# Patient Record
Sex: Male | Born: 1980 | Race: White | Hispanic: No | Marital: Married | Smoking: Former smoker
Health system: Southern US, Community
[De-identification: ages and names within clinical notes are randomized; demographics above are authoritative.]

## PROBLEM LIST (undated history)

## (undated) DIAGNOSIS — K649 Unspecified hemorrhoids: Secondary | ICD-10-CM

## (undated) DIAGNOSIS — B9681 Helicobacter pylori [H. pylori] as the cause of diseases classified elsewhere: Secondary | ICD-10-CM

## (undated) DIAGNOSIS — E119 Type 2 diabetes mellitus without complications: Secondary | ICD-10-CM

## (undated) DIAGNOSIS — K2970 Gastritis, unspecified, without bleeding: Secondary | ICD-10-CM

## (undated) DIAGNOSIS — K219 Gastro-esophageal reflux disease without esophagitis: Secondary | ICD-10-CM

## (undated) DIAGNOSIS — K7581 Nonalcoholic steatohepatitis (NASH): Secondary | ICD-10-CM

## (undated) DIAGNOSIS — E669 Obesity, unspecified: Secondary | ICD-10-CM

## (undated) HISTORY — DX: Type 2 diabetes mellitus without complications: E11.9

## (undated) HISTORY — DX: Gastro-esophageal reflux disease without esophagitis: K21.9

## (undated) HISTORY — DX: Nonalcoholic steatohepatitis (NASH): K75.81

## (undated) HISTORY — PX: OTHER SURGICAL HISTORY: SHX169

## (undated) HISTORY — DX: Gilbert syndrome: E80.4

## (undated) HISTORY — DX: Obesity, unspecified: E66.9

## (undated) HISTORY — DX: Gastritis, unspecified, without bleeding: K29.70

## (undated) HISTORY — DX: Unspecified hemorrhoids: K64.9

## (undated) HISTORY — PX: UPPER GASTROINTESTINAL ENDOSCOPY: SHX188

## (undated) HISTORY — DX: Helicobacter pylori (H. pylori) as the cause of diseases classified elsewhere: B96.81

---

## 2009-05-23 ENCOUNTER — Emergency Department (HOSPITAL_COMMUNITY): Admission: EM | Admit: 2009-05-23 | Discharge: 2009-05-23 | Payer: Self-pay | Admitting: Emergency Medicine

## 2009-05-26 ENCOUNTER — Encounter (INDEPENDENT_AMBULATORY_CARE_PROVIDER_SITE_OTHER): Payer: Self-pay | Admitting: *Deleted

## 2009-06-11 ENCOUNTER — Ambulatory Visit: Payer: Self-pay | Admitting: Gastroenterology

## 2009-06-11 DIAGNOSIS — K5900 Constipation, unspecified: Secondary | ICD-10-CM | POA: Insufficient documentation

## 2009-06-11 DIAGNOSIS — R109 Unspecified abdominal pain: Secondary | ICD-10-CM | POA: Insufficient documentation

## 2009-06-11 DIAGNOSIS — A048 Other specified bacterial intestinal infections: Secondary | ICD-10-CM | POA: Insufficient documentation

## 2009-06-11 DIAGNOSIS — K219 Gastro-esophageal reflux disease without esophagitis: Secondary | ICD-10-CM | POA: Insufficient documentation

## 2009-06-22 ENCOUNTER — Telehealth (INDEPENDENT_AMBULATORY_CARE_PROVIDER_SITE_OTHER): Payer: Self-pay | Admitting: *Deleted

## 2009-06-22 DIAGNOSIS — B9681 Helicobacter pylori [H. pylori] as the cause of diseases classified elsewhere: Secondary | ICD-10-CM

## 2009-06-22 HISTORY — DX: Gastritis, unspecified, without bleeding: B96.81

## 2009-06-23 ENCOUNTER — Encounter: Payer: Self-pay | Admitting: Gastroenterology

## 2009-06-25 ENCOUNTER — Ambulatory Visit: Payer: Self-pay | Admitting: Gastroenterology

## 2009-06-25 ENCOUNTER — Ambulatory Visit (HOSPITAL_COMMUNITY): Admission: RE | Admit: 2009-06-25 | Discharge: 2009-06-25 | Payer: Self-pay | Admitting: Gastroenterology

## 2009-06-25 DIAGNOSIS — K219 Gastro-esophageal reflux disease without esophagitis: Secondary | ICD-10-CM

## 2009-06-25 HISTORY — DX: Gastro-esophageal reflux disease without esophagitis: K21.9

## 2009-08-04 ENCOUNTER — Ambulatory Visit: Payer: Self-pay | Admitting: Gastroenterology

## 2009-08-04 DIAGNOSIS — Z8639 Personal history of other endocrine, nutritional and metabolic disease: Secondary | ICD-10-CM

## 2009-08-04 DIAGNOSIS — Z862 Personal history of diseases of the blood and blood-forming organs and certain disorders involving the immune mechanism: Secondary | ICD-10-CM

## 2009-08-05 ENCOUNTER — Encounter: Payer: Self-pay | Admitting: Gastroenterology

## 2009-08-10 ENCOUNTER — Encounter: Payer: Self-pay | Admitting: Gastroenterology

## 2009-08-10 LAB — CONVERTED CEMR LAB
Ceruloplasmin: 28 mg/dL (ref 21–63)
Ferritin: 244 ng/mL (ref 22–322)
HCV Ab: NEGATIVE
Hep A Total Ab: NEGATIVE
IgG (Immunoglobin G), Serum: 1230 mg/dL (ref 694–1618)
Saturation Ratios: 21 % (ref 20–55)

## 2009-08-13 ENCOUNTER — Ambulatory Visit (HOSPITAL_COMMUNITY): Admission: RE | Admit: 2009-08-13 | Discharge: 2009-08-13 | Payer: Self-pay | Admitting: Gastroenterology

## 2010-02-10 ENCOUNTER — Encounter (INDEPENDENT_AMBULATORY_CARE_PROVIDER_SITE_OTHER): Payer: Self-pay

## 2010-02-10 ENCOUNTER — Ambulatory Visit: Payer: Self-pay | Admitting: Gastroenterology

## 2010-02-10 DIAGNOSIS — K7689 Other specified diseases of liver: Secondary | ICD-10-CM | POA: Insufficient documentation

## 2010-05-02 ENCOUNTER — Encounter (INDEPENDENT_AMBULATORY_CARE_PROVIDER_SITE_OTHER): Payer: Self-pay

## 2010-05-25 ENCOUNTER — Encounter: Payer: Self-pay | Admitting: Gastroenterology

## 2010-05-27 LAB — CONVERTED CEMR LAB
ALT: 27 units/L (ref 0–53)
AST: 16 units/L (ref 0–37)
Bilirubin, Direct: 0.1 mg/dL (ref 0.0–0.3)
Indirect Bilirubin: 0.6 mg/dL (ref 0.0–0.9)
Total Bilirubin: 0.7 mg/dL (ref 0.3–1.2)

## 2010-06-21 NOTE — Assessment & Plan Note (Signed)
Summary: NPP/ABD PAIN.GU   Visit Type:  Initial Consult Referring Provider:  [facility] ER Primary Care Provider:  Day Berneda Rose ?name  Chief Complaint:  Abd pain.  History of Present Illness: 30 y/o obese caucasian male with generalized abd pain that started 3 weeks.  Was seen at [facility] 12/27 & 12/28 at [facility] with chest & abd pain & insomnia.  Also seen on 1/2 at [facility] ER with same abd pain.  c/o constant abd pain "all over"  c/o nausea without vomiting which has now resolved.   c/o heartburn & indigestion for months, getting worse until HP tx.  Finished h pylori tx 3 days ago, feels much better.  Denies dysphagia or odynophagia.  c/o chronic constipation with BM every 3-4 days.  Denies rectal bleeding or melena.  Wt steadily increasing.  Denies NSAIDS now, but was taking advil on regular basis 800mg  3 times per week.  The patient denies dysuria, GU symptoms, cardiac symptoms, cough, and shortness of breath.    Current Problems (verified): 1)  Constipation  (ICD-564.00) 2)  Helicobacter Pylori Infection  (ICD-041.86) 3)  Gerd  (ICD-530.81) 4)  Abdominal Pain  (ICD-789.00)  Current Medications (verified): 1)  No Meds Now 2)  Omeprazole 20 Mg Cpdr (Omeprazole) .... One By Mouth 30 Minutes Before Breakfast For Acid Reflux 3)  Miralax  Powd (Polyethylene Glycol 3350) .Marland KitchenMarland KitchenMarland Kitchen 17 Grams As Needed For Constipation  Allergies (verified): No Known Drug Allergies  Past History:  Past Medical History: Unremarkable  Past Surgical History: right leg benign tumor as child  Family History: No known family history of colorectal carcinoma, IBD, liver or chronic GI problems. Father: (45s) ? 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 Quit 3 mo ago, 18pkyr hx Alcohol Use - no Illicit Drug Use - no Daily Caffeine Use Patient does not get regular exercise.    Smoking Status:  quit Drug Use:  no Does Patient Exercise:  no  Review of Systems      See HPI General:  Complains of sleep disorder; denies fever, chills, sweats, anorexia, fatigue, weakness, malaise, and weight loss. CV:  Denies chest pains, angina, palpitations, syncope, dyspnea on exertion, orthopnea, PND, peripheral edema, and claudication. Resp:  Denies dyspnea at rest, dyspnea with exercise, cough, sputum, wheezing, coughing up blood, and pleurisy. GI:  Denies difficulty swallowing, pain on swallowing, jaundice, bloody BM's, black BMs, and fecal incontinence. GU:  Denies urinary burning, blood in urine, urinary frequency, urinary hesitancy, nocturnal urination, and urinary incontinence. Derm:  Denies rash, itching, dry skin, hives, moles, warts, and unhealing ulcers. Psych:  Denies depression, anxiety, memory loss, suicidal ideation, hallucinations, paranoia, phobia, and confusion. Heme:  Denies bruising, bleeding, and enlarged lymph nodes.  Vital Signs:  Patient profile:   30 year old male Height:      76 inches Weight:      305 pounds BMI:     37.26 Temp:     97.7 degrees F oral Pulse rate:   72 / minute BP sitting:   140 / 92  (left arm) Cuff size:   large  Vitals Entered By: Cloria Spring LPN (June 11, 2009 11:07 AM)  Physical Exam  General:  Well developed, well nourished, no acute distress.obese.   Head:  Normocephalic and atraumatic. Eyes:  Sclera clear, no icterus. Ears:  Normal auditory acuity. Nose:  No deformity, discharge,  or lesions. Mouth:  No deformity or lesions, dentition normal. Neck:  Supple; no masses or thyromegaly. Lungs:  Clear throughout to auscultation. Heart:  Regular rate and rhythm; no murmurs, rubs,  or bruits. Abdomen:  Soft, nontender and nondistended. No masses, hepatosplenomegaly or hernias noted. Normal bowel sounds.without guarding and without rebound.   Msk:  Symmetrical with no gross deformities. Normal posture. Pulses:   Normal pulses noted. Extremities:  No clubbing, cyanosis, edema or deformities noted. Neurologic:  Alert and  oriented x4;  grossly normal neurologically. Skin:  Intact without significant lesions or rashes. Cervical Nodes:  No significant cervical adenopathy. Psych:  Alert and cooperative. Normal mood and affect.  Impression & Recommendations:  Problem # 1:  ABDOMINAL PAIN (ICD-789.00) 30 y/o caucasian male with recent generalized abd pain, nausea & chest pain which resulted in ER visits where he ws found tobe h pylori positive.  Since he has finished treatment, he has done well without any concerns.  I suspect h pylori gastritis as the cullprit of his recent symptoms, but cannot r/o complicated GERD or occult gallbladder disease.  At any rate, since he has complete resolution of symptoms, will place him on PPI and see how he does.  He will contact us for further work-up if pain or nausea returns.  No red flags. Orders: Consultation Level IV (76160)  Problem # 2:  HELICOBACTER PYLORI INFECTION (ICD-041.86) s/p treatment  Problem # 3:  GERD (ICD-530.81) See #1  Problem # 4:  CONSTIPATION (ICD-564.00) Assessment: Improved He also had a bout of self-limited constipation which has resolved.  Has intermittant problems w/ this.  Patient Instructions: 1)  Begin Omeprazole 20 mg daily as directed 2)  Begin Miralax 17 grams daily as needed for constipation 3)  Office visit Dr Darrick Penna in 6 wks 4)  Avoid foods high in acid content ( tomatoes, citrus juices, spicy foods) . Avoid eating within 3 to 4 hours of lying down or before exercising. Do not over eat; try smaller more frequent meals. Elevate head of bed four inches when sleeping.  5)  Constipation and Hemorrhoids brochure given.  6)  GI Reflux brochure given.  7)  Call if severe pain returns or yu get worse. 8)  The medication list was reviewed and reconciled.  All changed / newly prescribed medications were explained.  A complete  medication list was provided to the patient / caregiver. Prescriptions: MIRALAX  POWD (POLYETHYLENE GLYCOL 3350) 17 grams as needed for constipation  #527 grams x 5   Entered and Authorized by:   Joselyn Arrow FNP-BC   Signed by:   Joselyn Arrow FNP-BC on 06/11/2009   Method used:   Electronically to        [facility] (ret[facility])       [address]       [place], [place]  [address]       Ph: [phone]       Fax: [phone]   RxID:   [id number] OMEPRAZOLE 20 MG CPDR (OMEPRAZOLE) one by mouth 30 minutes before breakfast for acid reflux  #31 x 2   Entered and Authorized by:   Joselyn Arrow FNP-BC   Signed by:   Joselyn Arrow FNP-BC on 06/11/2009   Method used:   Electronically to        [facility] (retail)       [address]  [place], [place]  [address]       Ph: [phone]       Fax: [phone]   RxID:   [id number]        Appended Document: NPP/ABD PAIN.GU Pt's wife called, "no better", please schedule EGD w/ [facility].   Appended Document: NPP/ABD PAIN.GU Pt scheduled for EGD on 06/25/09@7 :30am  Pt aware of appt.

## 2010-06-21 NOTE — Assessment & Plan Note (Signed)
Summary: ELEVATED AST, ALT, TBILI 1.6   Visit Type:  Follow-up Visit Primary Care Provider:  [facility]  Chief Complaint:  F/U Elevated liver enzymes.  History of Present Illness: No problems with NV, turning yellow or eyes yellow, or itching. Appetite: pretty good. No rectal bleeding or change in bowel habits. Chest and abd pain better. Last year gained 50 lbs in past year, but has lost 5-6 lbs after goign to gym. On hold now because car broke down. BMs: every day but Miralax as needed.  Current Medications (verified): 1)  No Meds Now 2)  Omeprazole 20 Mg Cpdr (Omeprazole) .... One By Mouth 30 Minutes Before Breakfast For Acid Reflux  Allergies (verified): No Known Drug Allergies  Past History:  Past Medical History: Last updated: 06/11/2009 Unremarkable  Family History: Last updated: 06/11/2009 No known family history of colorectal carcinoma, IBD, liver or chronic GI problems. Father: (32s) ? Mother: (62) DM, anxiety Siblings: 3 sisters 2 brothers healthy  Review of Systems       JAN 2011: LIP 22 T BILI 1.6 ALK PHOS 86 ALT 86 AST 71 ALB 4.5 CR 0.94, UDS NEG. HB 15.9 PLT 270  AAS: NEG UA WBC 3-6 BAC rare  Vital Signs:  Patient profile:   30 year old male Height:      76 inches Weight:      299 pounds BMI:     36.53 Temp:     97.9 degrees F oral Pulse rate:   72 / minute BP sitting:   128 / 90  (left arm) Cuff size:   large  Vitals Entered By: Cloria Spring LPN (August 04, 2009 10:15 AM)  Physical Exam  General:  Well developed, well nourished, no acute distress. Head:  Normocephalic and atraumatic. Eyes:  PERRLA, no icterus. Mouth:  No deformity or lesions. Neck:  Supple; no masses. Lungs:  Clear throughout to auscultation. Heart:  Regular rate and rhythm; no murmurs Abdomen:  Soft, nontender and nondistended.  Normal bowel sounds. Extremities:  No edema or deformities noted. Neurologic:  Alert and  oriented x4;  grossly normal neurologically.  Impression  & Recommendations:  Problem # 1:  GERD (ICD-530.81) Assessment Improved Continue omeprazole daily FOR ONE YEAR AND THEN MAY USE PRN. May try tomato based products.  Problem # 2:  LIVER FUNCTION TESTS, ABNORMAL, HX OF (ICD-V12.2) Likely 2o to NASH. Check labs: ASMA, HBsAg, TOTAL HAV, HEP C AB, CERULOPASMIN, FASTING FERRITIN AND IRON, and imaging.  Continue weight loss program.  OPV in 6 mos.  Patient Instructions: 1)  Continue weight loss program. 2)  Continue omeprazole daily. May try tomato based products. 3)  Get labs and ultrasound done. 4)  Return visit in 6 months.     Appended Document: Orders Update-charge    Clinical Lists Changes  Orders: Added new Service order of Est. Patient Level V (81191) - Signed

## 2010-06-21 NOTE — Miscellaneous (Signed)
Summary: Clinical list update

## 2010-06-21 NOTE — Progress Notes (Signed)
Summary: EGD  Phone Note Call from Patient Call back at Home Phone [phone]   Caller: Girlfriend Call For: Provider Reason for Call: Talk to Doctor Complaint: Abdominal Pain Action Taken: Provider Notified Summary of Call: Pt's girlfriend called stating he is not feeling any better and would like to have the procedure "where they run the light down your throat".  Please advise? Initial call taken by: Ave Filter,  June 22, 2009 4:40 PM

## 2010-06-21 NOTE — Letter (Signed)
Summary: Appointment Reminder  [facility]  [address]   [place], [place] [address]   Phone: [phone]  Fax: [phone]       May 26, 2009   TEO MOEDE [address] [place], [place]  [address] 1980-05-26    Dear Mr. Rosiak,  We have been unable to reach you by phone to schedule a follow up   appointment that was recommended for you by Dr. Jena Gauss. It is very   important that we reach you to schedule an appointment. We hope that you  allow [place] to participate in your health care needs. Please contact us at  [phone] at your earliest convenience to schedule your appointment.  Sincerely,    [facility] R. Roetta Sessions, M.D.    Kassie Mends, M.D. Lorenza Burton, FNP-BC    Tana Coast, PA-C Phone: [phone]    Fax: [phone]

## 2010-06-21 NOTE — Letter (Signed)
Summary: ABD [place] ORDER  ABD [place] ORDER   Imported By: Ave Filter 08/04/2009 14:14:09  _____________________________________________________________________  External Attachment:    Type:   Image     Comment:   External Document

## 2010-06-21 NOTE — Letter (Signed)
Summary: EGD ORDER  EGD ORDER   Imported By: Ricard Dillon 06/23/2009 12:50:43  _____________________________________________________________________  External Attachment:    Type:   Image     Comment:   External Document

## 2010-06-21 NOTE — Letter (Signed)
Summary: Internal Other /Lab order/ 08/04/2009  Internal Other /Lab order/ 08/04/2009   Imported By: Cloria Spring LPN 09/81/1914 78:29:56  _____________________________________________________________________  External Attachment:    Type:   Image     Comment:   External Document  Appended Document: GERD, NASH F/U OPV IS IN THE COMPUTER  Appended Document: Orders Update    Clinical Lists Changes  Orders: Added new Service order of Est. Patient Level II (21308) - Signed

## 2010-06-23 NOTE — Letter (Signed)
Summary: Recall, Labs Needed  [facility]  [address]   [place], [place] [address]   Phone: [phone]  Fax: [phone]    May 02, 2010  ATHA MCBAIN [address] [place], [place]  [address] 1981-04-18   Dear Mr. Folger,   Our records indicate it is time to repeat your blood work.  You can take the enclosed form to the lab on or near the date indicated.  Please make note of the new location of the lab:   [address], 2nd floor   [facility]  Our office will call you within a week to ten business days with the results.  If you do not hear from [place] in 10 business days, you should call the office.  If you have any questions regarding this, call the office at [phone], and ask for the nurse.  Labs are due on 05/26/2010.   Sincerely,    Hendricks Limes LPN  [facility] Ph: [phone]   Fax: [phone]

## 2010-06-23 NOTE — Assessment & Plan Note (Signed)
Summary: GERD, NASH   Visit Type:  Follow-up Visit Primary Care Provider:  [facility]  Chief Complaint:  Genella Rife and Elevated liver enzymes.  History of Present Illness: Last labs 2 mos ago. Heartburn controlled. No itching, yellow eyes, fatigue. Lost 3 lbs since last visit Mar 2011. Working out at home: May be every day.   Current Medications (verified): 1)  No Meds Now 2)  Omeprazole 20 Mg Cpdr (Omeprazole) .... One By Mouth 30 Minutes Before Breakfast For Acid Reflux  Allergies (verified): No Known Drug Allergies  Past History:  Past Medical History: NASH GERD  Family History: Reviewed history from 06/11/2009 and no changes required. No known family history of colorectal carcinoma, IBD, liver or chronic GI problems. Father: (32s) ? 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 Quit 3 mo ago, 18pkyr hx Alcohol Use - no Illicit Drug Use - no Daily Caffeine Use Patient does getS INTERMITTENT regular exercise.   Vital Signs:  Patient profile:   30 year old male Height:      76 inches Weight:      296.50 pounds BMI:     36.22 Temp:     97.8 degrees F oral Pulse rate:   72 / minute BP sitting:   128 / 76  (left arm) Cuff size:   large  Vitals Entered By: Cloria Spring LPN (February 10, 2010 9:41 AM)  Physical Exam  General:  Well developed, well nourished, no acute distress. Head:  Normocephalic and atraumatic. Eyes:  PERRL, no icterus. Mouth:  No deformity or lesions. Lungs:  Clear throughout to auscultation. Heart:  Regular rate and rhythm; no murmurs Abdomen:  Soft, nontender and nondistended.  Normal bowel sounds. obese.    Impression & Recommendations:  Problem # 1:  GERD (ICD-530.81) Assessment Improved May try doing w/o meds but if Sx of GERD pt should resume meds. Refill X1 year. OPV in  12 mos.  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 More ideal weight is 245-250 lbs, which places the pt at being overweight, NOT obese. Explained to pt that losing weight will improve enzymes and persistent elevation in enzymes may lead to cirrhosis. OPV in 12 mos. Prescriptions: OMEPRAZOLE 20 MG CPDR (OMEPRAZOLE) one by mouth 30 minutes before breakfast for acid reflux  #30 x 11   Entered and Authorized by:   West Bali MD   Signed by:   West Bali MD on 02/10/2010   Method used:   Electronically to        [facility] (retail)       [address]       [place], [place]  [address]       Ph: [phone]       Fax: [phone]   RxID:   [id number]   Appended Document: GERD, NASH F/U OPV IS IN THE COMPUTER  Appended Document: Orders Update    Clinical Lists Changes  Orders: Added new Service order of Est. Patient Level II (38756) - Signed      Appended Document: GERD, NASH APR 2011 HB 15.9 PLT 329 ESR 1

## 2010-08-07 LAB — URINE MICROSCOPIC-ADD ON

## 2010-08-07 LAB — CBC
MCHC: 34.4 g/dL (ref 30.0–36.0)
Platelets: 270 10*3/uL (ref 150–400)
RBC: 5.38 MIL/uL (ref 4.22–5.81)
RDW: 12.4 % (ref 11.5–15.5)

## 2010-08-07 LAB — URINALYSIS, ROUTINE W REFLEX MICROSCOPIC
Bilirubin Urine: NEGATIVE
Nitrite: NEGATIVE
Urobilinogen, UA: 0.2 mg/dL (ref 0.0–1.0)

## 2010-08-07 LAB — COMPREHENSIVE METABOLIC PANEL
Alkaline Phosphatase: 86 U/L (ref 39–117)
Creatinine, Ser: 0.94 mg/dL (ref 0.4–1.5)
Glucose, Bld: 112 mg/dL — ABNORMAL HIGH (ref 70–99)
Potassium: 3.8 mEq/L (ref 3.5–5.1)
Sodium: 137 mEq/L (ref 135–145)
Total Protein: 8.1 g/dL (ref 6.0–8.3)

## 2010-08-07 LAB — LIPASE, BLOOD: Lipase: 22 U/L (ref 11–59)

## 2010-08-07 LAB — RAPID URINE DRUG SCREEN, HOSP PERFORMED
Cocaine: NOT DETECTED
Opiates: NOT DETECTED

## 2010-08-07 LAB — DIFFERENTIAL
Basophils Absolute: 0 10*3/uL (ref 0.0–0.1)
Eosinophils Absolute: 0.1 10*3/uL (ref 0.0–0.7)
Eosinophils Relative: 1 % (ref 0–5)
Lymphs Abs: 1.8 10*3/uL (ref 0.7–4.0)

## 2010-08-12 IMAGING — US US ABDOMEN COMPLETE
1 series · 14 of 25 positions shown · non-contrast
Comparison: None

CLINICAL DATA: Transaminitis

ULTRASOUND ABDOMEN:
TECHNIQUE: Sonography of upper abdominal structures was performed.

[Series 1: us abdomen complete · 0.35mm/px · 14 of 65 slices shown]
[im 1/65]
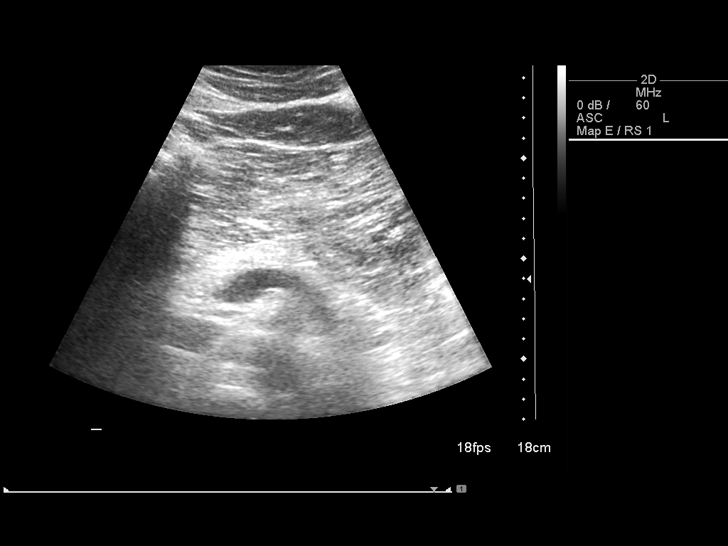
[im 6/65]
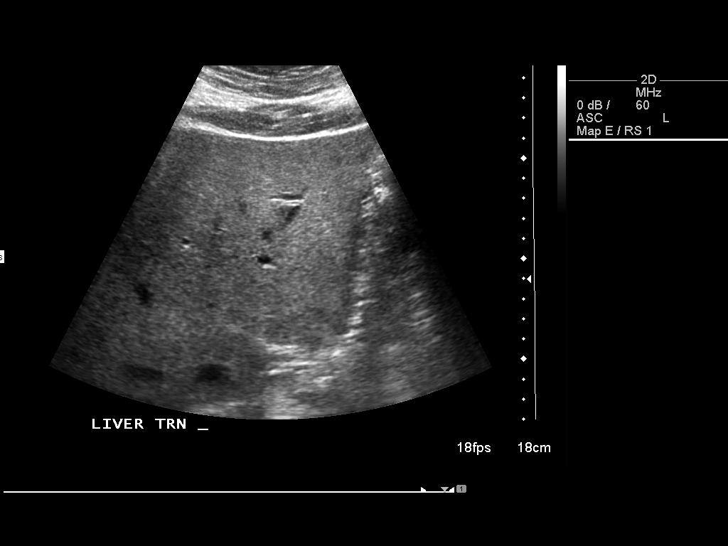
[im 11/65]
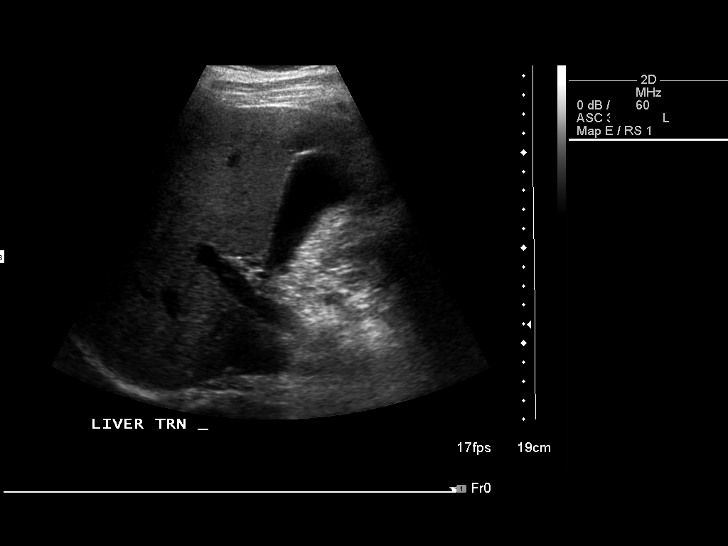
[im 17/65]
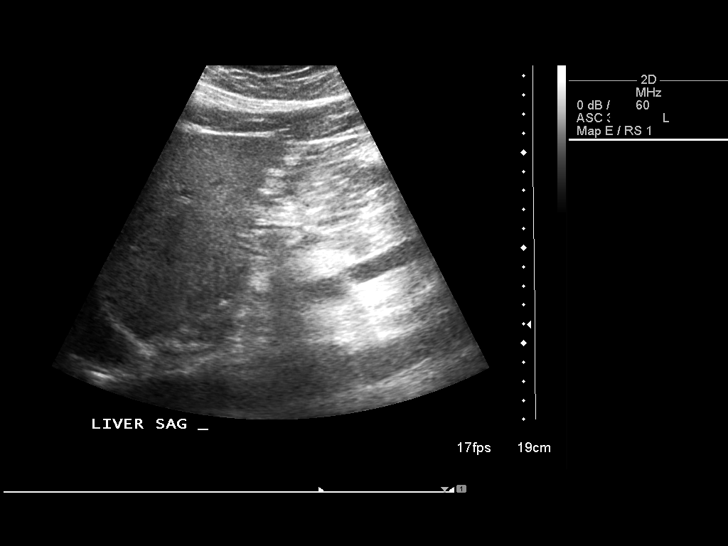
[im 22/65]
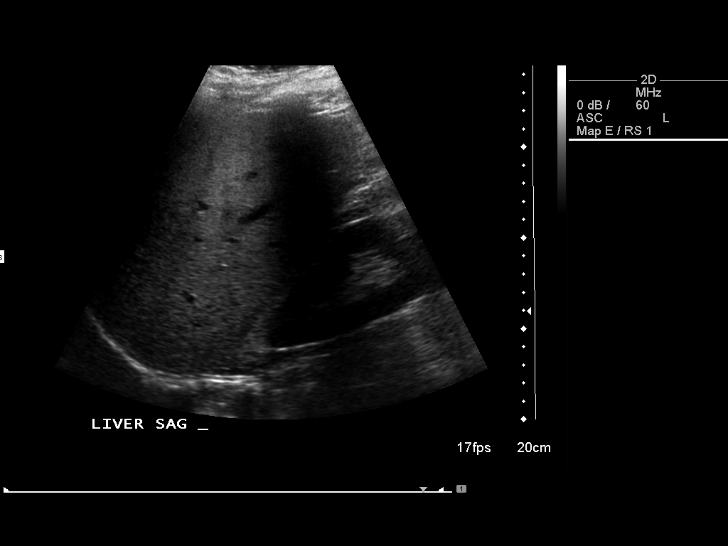
[im 25/65]
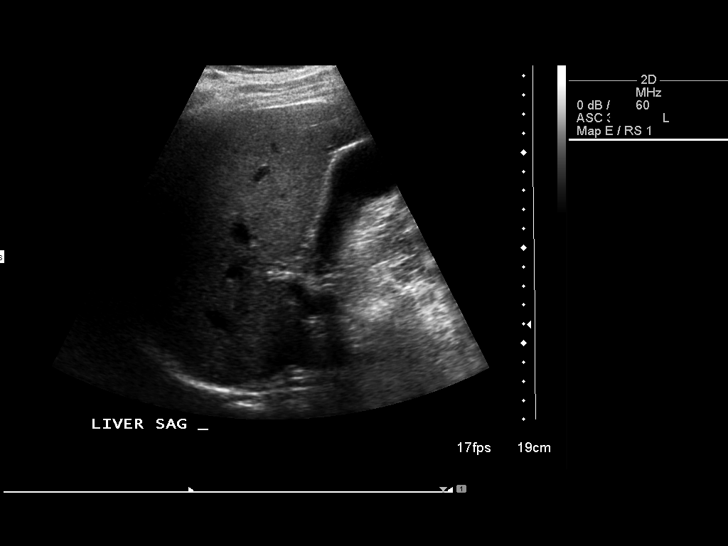
[im 30/65]
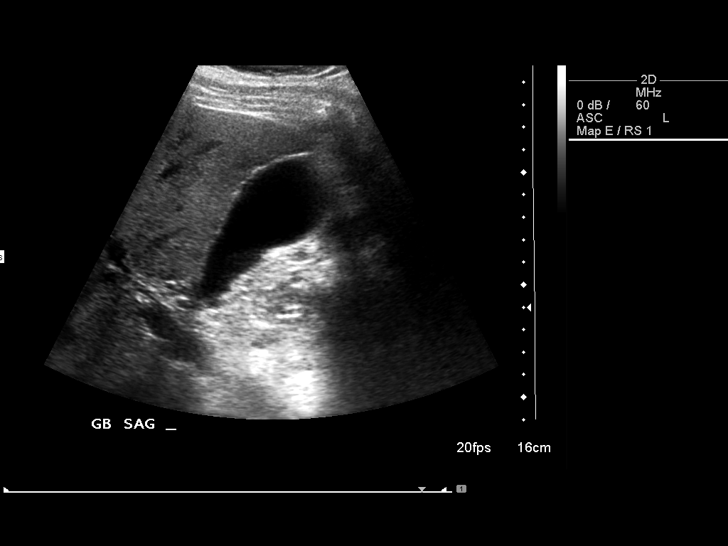
[im 35/65]
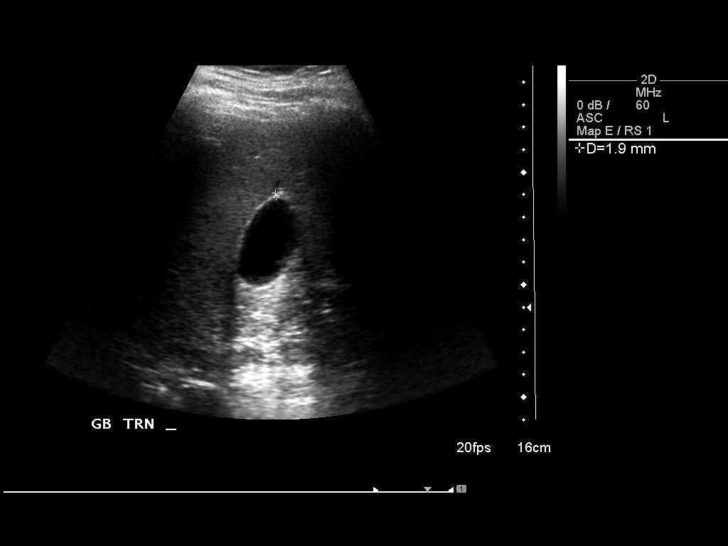
[im 41/65]
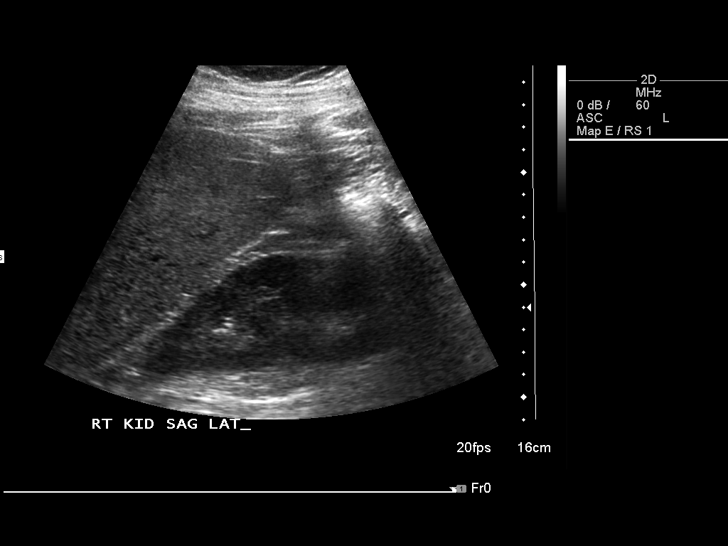
[im 43/65]
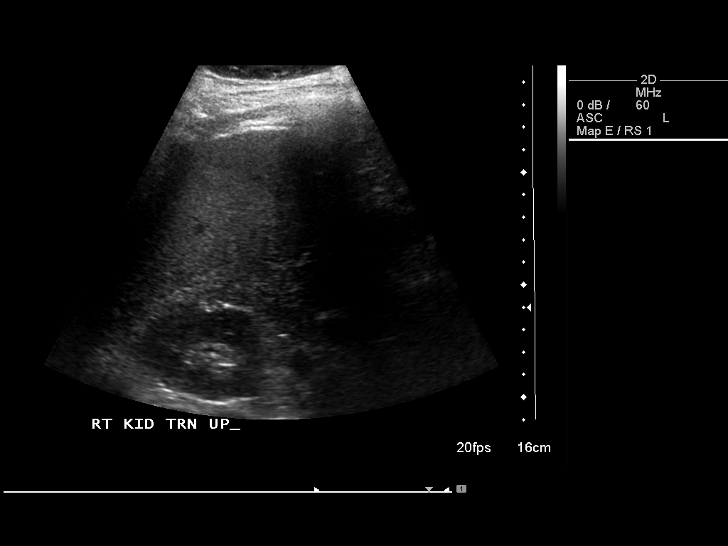
[im 49/65]
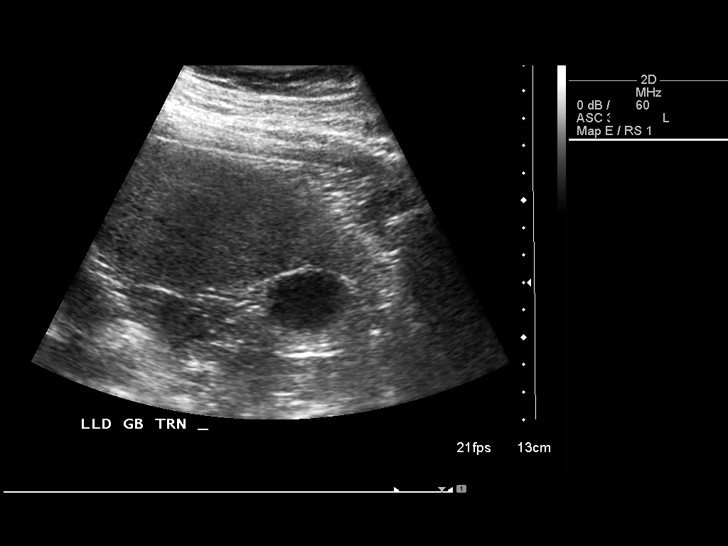
[im 54/65]
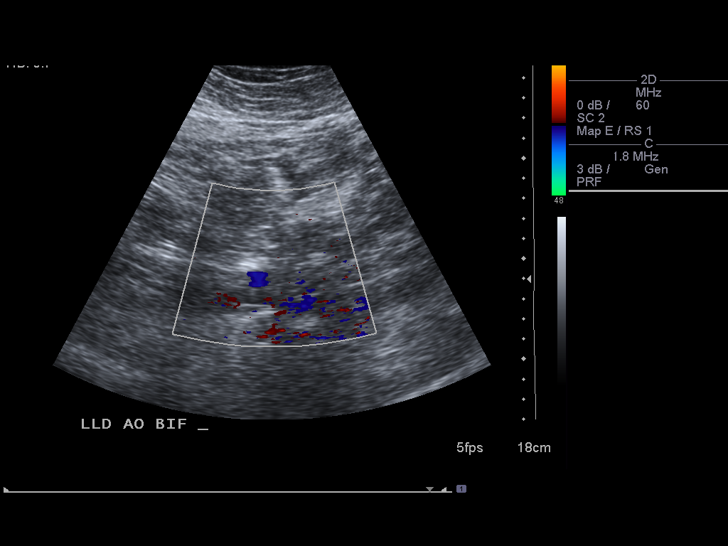
[im 59/65]
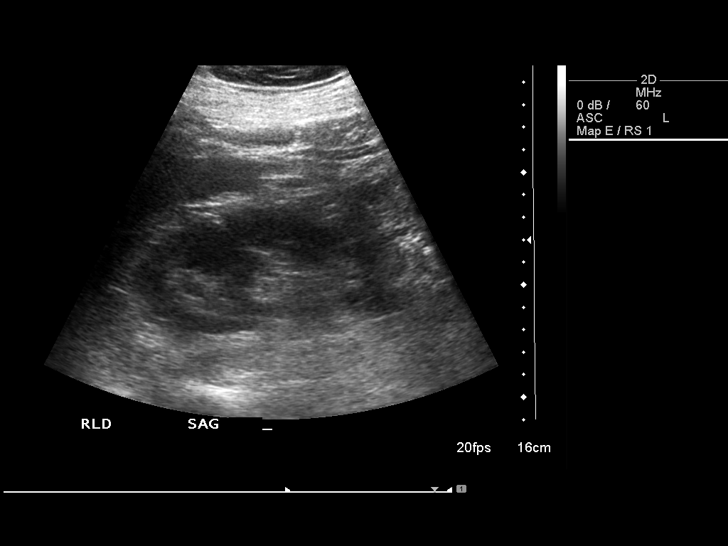
[im 65/65]
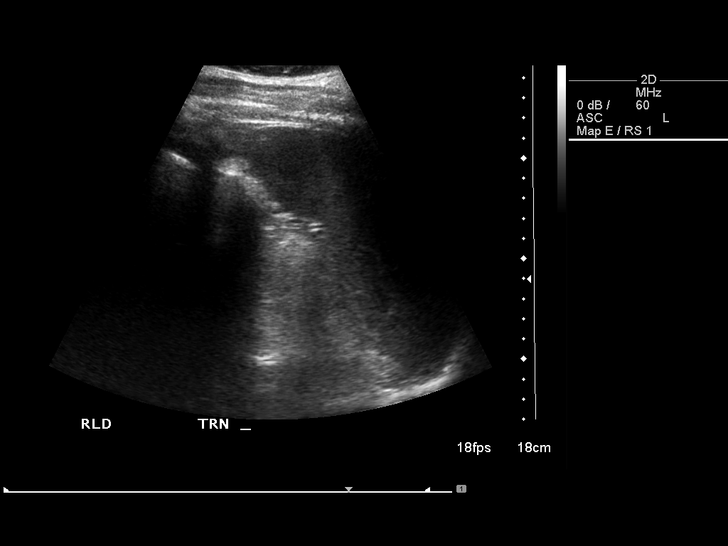

[14 of 25 positions shown; findings below may reference images not displayed]

Gallbladder:  Normally distended without stones or wall thickening.
No pericholecystic fluid or sonographic Murphy's sign.

Common bile duct:  6 mm diameter, upper normal.

Liver:  Echogenic, likely fatty infiltration, though this can be
seen with cirrhosis and certain infiltrative disorders. No focal
hepatic mass or nodularity.

IVC:  Unremarkable

Pancreas:  Normal appearance

Spleen:  Normal appearance, 11.0 cm length.

Right kidney:  11.8 cm length.  Normal morphology without mass or
hydronephrosis.

Left kidney:  12.5 cm length.  Normal morphology without mass or
hydronephrosis.

Aorta:  Unremarkable

Other:  No free fluid
IMPRESSION: Probable fatty infiltration of liver as above.
No acute abnormalities.

## 2010-10-06 ENCOUNTER — Encounter: Payer: Self-pay | Admitting: Urgent Care

## 2010-10-06 ENCOUNTER — Ambulatory Visit (INDEPENDENT_AMBULATORY_CARE_PROVIDER_SITE_OTHER): Payer: Medicaid Other | Admitting: Urgent Care

## 2010-10-06 VITALS — BP 150/83 | HR 74 | Temp 97.4°F | Ht 76.0 in | Wt 310.0 lb

## 2010-10-06 DIAGNOSIS — K219 Gastro-esophageal reflux disease without esophagitis: Secondary | ICD-10-CM

## 2010-10-06 DIAGNOSIS — R197 Diarrhea, unspecified: Secondary | ICD-10-CM

## 2010-10-06 DIAGNOSIS — K7689 Other specified diseases of liver: Secondary | ICD-10-CM

## 2010-10-06 MED ORDER — HYOSCYAMINE SULFATE 0.125 MG SL SUBL: 0.1250 mg | SUBLINGUAL_TABLET | Freq: Three times a day (TID) | SUBLINGUAL | Status: DC

## 2010-10-06 NOTE — Progress Notes (Signed)
Cc to PCP 

## 2010-10-06 NOTE — Assessment & Plan Note (Addendum)
Well controlled on omeprazole 20mg daily.  °

## 2010-10-06 NOTE — Progress Notes (Signed)
Referring Provider: Juliette Alcide, [place] Primary Care Physician:  Juliette Alcide, [place] Primary Gastroenterologist:  Dr. Darrick Penna  Chief Complaint  Patient presents with  . Diarrhea    HPI:  Frank Lucero is a 30 y.o. male here for a new evaluation of positive IBD panel/diarrhea.  Previously been followed by Dr Darrick Penna for NASH & GERD.  Wt up 16# since last OV 8 mo ago.  C/o lower abd pain every time he leaves house x 1-1/2 yrs.  Very rarely happens at home.  Pain feels like twisting intestines, diaphoresis, & diarrhea.  Goes to bathroom w/ 4 minutes of diarrhea.  Denies mucus, rectal bleeding, or melena.  Episodes 1-2 times per day a few days per week if he leaves the house.   Pain 8/10 at worst, always relieved w/ defecation.  BM 1-2 per day.  Denies nausea, vomiting, heartburn or indigestion.  Denies dysphagia or odynophagia.  Denies fever/chills.  Appetite ok.  Never had colonoscopy.  Tried pepto-no help.  Labs from 09/28/10 show positive saocharomyces cerevisiae IgA,  Otherwise negative IBD panel.  Normal CBC, CMP, TSH.  Past Medical History  Diagnosis Date  . NASH (nonalcoholic steatohepatitis)   . GERD (gastroesophageal reflux disease) 06/25/09    EGD Dr Darrick Penna small HH, mild gastritis, duodenitis  . Obesity   . Helicobacter pylori gastritis 06/2009    s/p treatment    Past Surgical History  Procedure Date  . Benign tumor     Right leg as a child    Current Outpatient Prescriptions  Medication Sig Dispense Refill  . omeprazole (PRILOSEC) 20 MG capsule Take 20 mg by mouth daily.        . hyoscyamine (LEVSIN/SL) 0.125 MG SL tablet Place 1 tablet (0.125 mg total) under the tongue 4 (four) times daily -  before meals and at bedtime.  90 tablet  1    Allergies as of 10/06/2010  . (No Known Allergies)    Family History  Problem Relation Age of Onset  . Colon cancer Maternal Grandfather 6    History   Social History  . Marital Status: Single    Spouse Name: N/A    Number  of Children: 2  . Years of Education: N/A   Occupational History  . 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 Smoking status: Former Smoker -- 1.0 packs/day for 18 years    Types: Cigarettes    Quit date: 11/10/2009  . Smokeless tobacco: Not on file  . Alcohol Use: No  . Drug Use: No  . Sexually Active: Not on file   Review of Systems: Gen: Denies any fever, chills, sweats, anorexia, fatigue, weakness, malaise, weight loss, and sleep disorder CV: Denies chest pain, angina, palpitations, syncope, orthopnea, PND, peripheral edema, and claudication. Resp: Denies dyspnea at rest, dyspnea with exercise, cough, sputum, wheezing, coughing up blood, and pleurisy. GI: Denies vomiting blood, jaundice, and fecal incontinence.    Psych: Denies depression, anxiety, memory loss, suicidal ideation, hallucinations, paranoia, and confusion.  C/o agoraphobia. Heme: Denies bruising, bleeding, and enlarged lymph nodes.  Physical Exam: BP 150/83  Pulse 74  Temp(Src) 97.4 F (36.3 C) (Temporal)  Ht 6\' 4"  (1.93 m)  Wt 310 lb (140.615 kg)  BMI 37.73 kg/m2 General:   Alert,  Well-developed, obese, pleasant and cooperative in NAD Head:  Normocephalic and atraumatic. Eyes:  Sclera clear, no icterus.   Conjunctiva pink. Mouth:  No deformity or  lesions, dentition normal. Neck:  Supple; no masses or thyromegaly. Heart:  Regular rate and rhythm; no murmurs, clicks, rubs,  or gallops. Abdomen:  Obese, Soft, nontender and nondistended. No masses, hepatosplenomegaly or hernias noted. Normal bowel sounds, without guarding, and without rebound.   Msk:  Symmetrical without gross deformities. Normal posture. Pulses:  Normal pulses noted. Extremities:  Without clubbing or edema. Neurologic:  Alert and  oriented x4;  grossly normal neurologically. Skin:  Intact without significant lesions or rashes. Cervical Nodes:  No significant cervical adenopathy. Psych:  Alert and  cooperative. Normal mood and affect.

## 2010-10-06 NOTE — Assessment & Plan Note (Addendum)
Frank Lucero is a 30 y.o. caucasian male w/ chronic intermittent diarrhea.  His BMI is 37.73.  His symptoms meet ROME diagnostic criteria for IBS.  There are no alarm features including weight loss, bleeding, anemia, fever.  His diarrhea is intermittent, post-prandial & almost always associated with leaving his home, only present a few days per week.  In this setting, a positive IgA saocharomyces cerevisiae may not be clinically significant.  A small percentage of healthy controls are positive for either IgA or IgG.  It can be a marker for other diseases as well including celiac disease, Behcet's, autoimmune liver disease or rheumatological diseases.  Keeping that in mind, we will treat him symptomatically for IBS.  If no improvement in symptoms or he develops alarm features, would pursue further evaluation w/ ileocolonscopy w/ Dr Darrick Penna & consider celiac ab evaluation.  Office visit in 4 weeks to assess his progress w/ Dr Darrick Penna. Levsin 0.125mg  ac/hs prn diarrhea Fiber supplement of choice daily ALIGN once daily If bleeding, fever, severe pain, vomiting, severe diarrhea, please call sooner

## 2010-10-06 NOTE — Assessment & Plan Note (Signed)
NAFLD in setting of obesity.  Normal LFTs.  Instructions for fatty liver: Recommend 1-2# weight loss per week until ideal body weight through exercise & diet. Low fat/cholesterol diet. Gradually increase exercise from 15 min daily up to 1 hr per day 5 days/week. Limit alcohol use.

## 2010-10-06 NOTE — Patient Instructions (Addendum)
Try ALIGN probiotic once daily Use levsin 0.125mg  as directed for diarrhea Start fiber supplement of choice as soon as possible and take every day. If bleeding, fever, severe pain, vomiting, severe diarrhea, please call sooner  Irritable Bowel Syndrome (Spastic Colon) Irritable Bowel Syndrome (IBS) is caused by a disturbance of normal bowel function. Other terms used are spastic colon, mucous colitis, and irritable colon. It does not require surgery, nor does it lead to cancer. There is no cure for IBS. But with proper diet, stress reduction, and medication, you will find that your problems (symptoms) will gradually disappear or improve. IBS is a common digestive disorder. It usually appears in late adolescence or early adulthood. Women develop it twice as often as men. CAUSES After food has been digested and absorbed in the small intestine, waste material is moved into the colon (large intestine). In the colon, water and salts are absorbed from the undigested products coming from the small intestine. The remaining residue, or fecal material, is held for elimination. Under normal circumstances, gentle, rhythmic contractions on the bowel walls push the fecal material along the colon towards the rectum. In IBS, however, these contractions are irregular and poorly coordinated. The fecal material is either retained too long, resulting in constipation, or expelled too soon, producing diarrhea. SYMPTOMS  The most common symptom of IBS is pain. It is typically in the lower left side of the belly (abdomen). But it may occur anywhere in the abdomen. It can be felt as heartburn, backache, or even as a dull pain in the arms or shoulders. The pain comes from excessive bowel-muscle spasms and from the buildup of gas and fecal material in the colon. This pain:  Can range from sharp belly (abdominal) cramps to a dull, continuous ache.   Usually worsens soon after eating.   Is typically relieved by having a bowel  movement or passing gas.  Abdominal pain is usually accompanied by constipation. But it may also produce diarrhea. The diarrhea typically occurs right after a meal or upon arising in the morning. The stools are typically soft and watery. They are often flecked with secretions (mucus). Other symptoms of IBS include:  Bloating.  Loss of appetite.   Heartburn.  Feeling sick to your stomach  (nausea).   Belching  Vomiting   Gas.  IBS may also cause a number of symptoms that are unrelated to the digestive system:  Fatigue.  Headaches.   Anxiety  Shortness of breath   Difficulty in concentrating.  Dizziness.   These symptoms tend to come and go. DIAGNOSIS The symptoms of IBS closely mimic the symptoms of other, more serious digestive disorders. So your caregiver may wish to perform a variety of additional tests to exclude these disorders. He/she wants to be certain of learning what is wrong (diagnosis). The nature and purpose of each test will be explained to you. TREATMENT A number of medications are available to help correct bowel function and/or relieve bowel spasms and abdominal pain. Among the drugs available are:  Mild, non-irritating laxatives for severe constipation and to help restore normal bowel habits.   Specific anti-diarrheal medications to treat severe or prolonged diarrhea.   Anti-spasmodic agents to relieve intestinal cramps.   Your caregiver may also decide to treat you with a mild tranquilizer or sedative during unusually stressful periods in your life.  The important thing to remember is that if any drug is prescribed for you, make sure that you take it exactly as directed. Make sure that  your caregiver knows how well it worked for you. HOME CARE INSTRUCTIONS   Avoid foods that are high in fat or oils. Some examples ZOX:WRUEA cream, butter, frankfurters, sausage, and other fatty meats.   Avoid foods that have a laxative effect, such as fruit, fruit juice, and  dairy products.   Cut out carbonated drinks, chewing gum, and "gassy" foods, such as beans and cabbage. This may help relieve bloating and belching.   Bran taken with plenty of liquids may help relieve constipation.   Keep track of what foods seem to trigger your symptoms.   Avoid emotionally charged situations or circumstances that produce anxiety.   Start or continue exercising.   Get plenty of rest and sleep.  MAKE SURE YOU:   Understand these instructions.   Will watch your condition.   Will get help right away if you are not doing well or get worse.  Document Released: 05/08/2005 Document Re-Released: 09/24/2008 [facility] Patient Information 2011 [place], [place].

## 2010-10-10 ENCOUNTER — Other Ambulatory Visit: Payer: Self-pay

## 2010-10-10 DIAGNOSIS — K589 Irritable bowel syndrome without diarrhea: Secondary | ICD-10-CM

## 2010-10-10 NOTE — Progress Notes (Signed)
Pt informed. Lab orders and stool bottles at front for pick-up.

## 2010-10-10 NOTE — Progress Notes (Signed)
Needs TTG IgA and stools studies if not already done. E: 30 visit with [facility] in 3-4 weeks.

## 2010-10-10 NOTE — Progress Notes (Signed)
Please call pt & let him know Dr. Darrick Penna has reviewed his reports & wants labs listed below to r/o celiac disease & infection & place orders below. Thanks

## 2010-10-11 ENCOUNTER — Other Ambulatory Visit: Payer: Self-pay | Admitting: Gastroenterology

## 2010-10-11 ENCOUNTER — Encounter: Payer: Self-pay | Admitting: Urgent Care

## 2010-10-12 LAB — TISSUE TRANSGLUTAMINASE, IGA: Tissue Transglutaminase Ab, IgA: 3.9 U/mL (ref <20)

## 2010-10-12 LAB — IGA: IgA: 387 mg/dL — ABNORMAL HIGH (ref 68–378)

## 2010-10-12 LAB — GIARDIA ANTIGEN: Giardia Screen (EIA): NEGATIVE

## 2010-10-12 LAB — CLOSTRIDIUM DIFFICILE BY PCR: Toxigenic C. Difficile by PCR: NOT DETECTED

## 2010-10-13 ENCOUNTER — Telehealth: Payer: Self-pay | Admitting: Urgent Care

## 2010-10-13 NOTE — Telephone Encounter (Signed)
Cc to Dr. Leandrew Koyanagi

## 2010-10-13 NOTE — Telephone Encounter (Signed)
Please call pt- There is no c diff or inflammation noted in his stools There is no evidence of celiac disease on his blood work IgA was  slightly elevated.  This is non-specific. Keep FU as planned w/ Dr Darrick Penna ZO:XWRU to PCP

## 2010-10-13 NOTE — Telephone Encounter (Signed)
Routed to [facility]/ labs to PCP.

## 2010-10-13 NOTE — Telephone Encounter (Signed)
Cc to Dr. Burdine 

## 2010-10-13 NOTE — Telephone Encounter (Signed)
Pt informed of the results.

## 2010-10-13 NOTE — Telephone Encounter (Deleted)
l °

## 2010-10-21 HISTORY — PX: COLONOSCOPY: SHX174

## 2010-10-24 ENCOUNTER — Encounter: Payer: Self-pay | Admitting: Gastroenterology

## 2010-10-24 NOTE — Progress Notes (Signed)
Pt is aware of OV on 11/10/10 at 330pm with SF

## 2010-11-10 ENCOUNTER — Encounter: Payer: Self-pay | Admitting: Gastroenterology

## 2010-11-10 ENCOUNTER — Ambulatory Visit (INDEPENDENT_AMBULATORY_CARE_PROVIDER_SITE_OTHER): Payer: Medicaid Other | Admitting: Gastroenterology

## 2010-11-10 VITALS — BP 137/77 | HR 82 | Temp 97.6°F | Ht 76.0 in | Wt 311.0 lb

## 2010-11-10 DIAGNOSIS — Z862 Personal history of diseases of the blood and blood-forming organs and certain disorders involving the immune mechanism: Secondary | ICD-10-CM

## 2010-11-10 DIAGNOSIS — K589 Irritable bowel syndrome without diarrhea: Secondary | ICD-10-CM

## 2010-11-10 DIAGNOSIS — Z8639 Personal history of other endocrine, nutritional and metabolic disease: Secondary | ICD-10-CM

## 2010-11-10 DIAGNOSIS — K625 Hemorrhage of anus and rectum: Secondary | ICD-10-CM

## 2010-11-10 MED ORDER — IMIPRAMINE HCL 10 MG PO TABS: ORAL_TABLET | ORAL | Status: DC

## 2010-11-10 NOTE — Progress Notes (Signed)
Cc to Dr. Burdine 

## 2010-11-10 NOTE — Progress Notes (Signed)
  Subjective:    Patient ID: Frank Lucero, male    DOB: 05/31/1980, 30 y.o.   MRN: [id number]  PCP: Leandrew Koyanagi  HPI Didn't take Levsin. Has seen blood in his tool but no straining or heavy lifting. Happened after he got a rectal exam. Saw blood for qod now. Yesterday BM: regular, soft. Last time watery stool: 1 hour ago. DOESN'T WANT TO TAKE FLUOXETINE. Has 2 kids at home that he cares for. Girlfriend build blowers. Milk: cereal every AM. Rare ice cream. ICEE sticks. Cheese: regular. Pain in abd better with passing gas or BM. No fever or chills. Feels good when he eaves the house.   Past Medical History  Diagnosis Date  . GERD (gastroesophageal reflux disease) 06/25/09    EGD Dr Darrick Penna small HH, mild gastritis, duodenitis  . Obesity   . Helicobacter pylori gastritis 06/2009    s/p treatment  . NASH (nonalcoholic steatohepatitis) JAN 2011 305 LBS, BMI 37.14 Aug 2009 ABD U/S-FATTY LIVER   Past Surgical History  Procedure Date  . Benign tumor     Right leg as a child  . Upper gastrointestinal endoscopy FEB 2011 [place], [facility]    HH, CHRONIC GASTRITIS, DUODENITIS   No Known Allergies Current Outpatient Prescriptions  Medication Sig Dispense Refill  . omeprazole (PRILOSEC) 20 MG capsule Take 20 mg by mouth daily.        .       Family History  Problem Relation Age of Onset  . Colon cancer Maternal Grandfather 52      Review of Systems  All other systems reviewed and are negative.       Objective:   Physical Exam  Vitals reviewed. Constitutional: He is oriented to person, place, and time. He appears well-developed and well-nourished. No distress.  HENT:  Head: Normocephalic and atraumatic.  Mouth/Throat: Oropharynx is clear and moist. No oropharyngeal exudate.  Eyes: Pupils are equal, round, and reactive to light. No scleral icterus.  Neck: Normal range of motion. Neck supple.  Cardiovascular: Normal rate, regular rhythm and normal heart sounds.   Pulmonary/Chest: Effort  normal and breath sounds normal.  Abdominal: Soft. Bowel sounds are normal. He exhibits no distension. There is Tenderness: MILD TTP IN RUQ/RLQ.Marland Kitchen There is no rebound and no guarding.  Musculoskeletal: He exhibits no edema.  Lymphadenopathy:    He has no cervical adenopathy.  Neurological: He is alert and oriented to person, place, and time.  Skin: No rash noted.          Assessment & Plan:

## 2010-11-10 NOTE — Patient Instructions (Signed)
Take Imipramine at bedtime. It can cause drowsiness, constipation, dry mouth, or difficulty urinating. Colonoscopy June 28. HOLD FIBER UNTIL AFTER YOUR COLONOSCOPY. CONTINUE YOUR PROBIOTICS. Follow up in 3 mos.

## 2010-11-11 NOTE — Progress Notes (Signed)
Pt is aware of OV for 8/16 @ 4pm with SF

## 2010-11-14 ENCOUNTER — Encounter: Payer: Self-pay | Admitting: Gastroenterology

## 2010-11-14 DIAGNOSIS — K625 Hemorrhage of anus and rectum: Secondary | ICD-10-CM | POA: Insufficient documentation

## 2010-11-14 DIAGNOSIS — K589 Irritable bowel syndrome without diarrhea: Secondary | ICD-10-CM | POA: Insufficient documentation

## 2010-11-14 NOTE — Assessment & Plan Note (Signed)
Most likely 2o to hemorrhoids. The differential diagnosis includes colorectal polyp, and less colon CA or AVM.  TCS June 28 w/ propofol 2o to polypharmacy and unable to be adequately sedated using high dose Demerol/ used with upper endoscopy in 2009.

## 2010-11-14 NOTE — Assessment & Plan Note (Signed)
MIXED PATTERN. Sx not ideally controlled, now with diarrhea (CDIFF AND TTG IGA  NEG)  Take Imipramine at bedtime. It can cause drowsiness, constipation, dry mouth, or difficulty urinating. Colonoscopy June 28. HOLD FIBER UNTIL AFTER YOUR COLONOSCOPY. CONTINUE YOUR PROBIOTICS. Follow up in 3 mos.

## 2010-11-14 NOTE — Assessment & Plan Note (Signed)
Encouraged weight loss 

## 2010-11-15 ENCOUNTER — Encounter (HOSPITAL_COMMUNITY)
Admission: RE | Admit: 2010-11-15 | Discharge: 2010-11-15 | Disposition: A | Discharge status: Home / Self Care | Payer: Medicaid Other | Source: Ambulatory Visit | Attending: Gastroenterology | Admitting: Gastroenterology

## 2010-11-15 LAB — BASIC METABOLIC PANEL
CO2: 24 mEq/L (ref 19–32)
GFR calc Af Amer: >60 mL/min (ref >60)
Glucose, Bld: 95 mg/dL (ref 70–99)
Sodium: 137 mEq/L (ref 135–145)

## 2010-11-15 LAB — HEMOGLOBIN AND HEMATOCRIT, BLOOD: Hemoglobin: 15.9 g/dL (ref 13.0–17.0)

## 2010-11-17 ENCOUNTER — Other Ambulatory Visit: Payer: Self-pay | Admitting: Gastroenterology

## 2010-11-17 ENCOUNTER — Ambulatory Visit (HOSPITAL_COMMUNITY)
Admission: RE | Admit: 2010-11-17 | Discharge: 2010-11-17 | Disposition: A | Discharge status: Home / Self Care | Payer: Medicaid Other | Source: Ambulatory Visit | Attending: Gastroenterology | Admitting: Gastroenterology

## 2010-11-17 ENCOUNTER — Encounter: Payer: Medicaid Other | Admitting: Gastroenterology

## 2010-11-17 DIAGNOSIS — R197 Diarrhea, unspecified: Secondary | ICD-10-CM | POA: Insufficient documentation

## 2010-11-17 DIAGNOSIS — K633 Ulcer of intestine: Secondary | ICD-10-CM | POA: Insufficient documentation

## 2010-11-17 DIAGNOSIS — K219 Gastro-esophageal reflux disease without esophagitis: Secondary | ICD-10-CM

## 2010-11-17 DIAGNOSIS — Z79899 Other long term (current) drug therapy: Secondary | ICD-10-CM | POA: Insufficient documentation

## 2010-11-17 DIAGNOSIS — K648 Other hemorrhoids: Secondary | ICD-10-CM

## 2010-11-17 DIAGNOSIS — K7689 Other specified diseases of liver: Secondary | ICD-10-CM

## 2010-11-17 DIAGNOSIS — R109 Unspecified abdominal pain: Secondary | ICD-10-CM

## 2010-11-17 DIAGNOSIS — K921 Melena: Secondary | ICD-10-CM

## 2010-11-17 DIAGNOSIS — Z01812 Encounter for preprocedural laboratory examination: Secondary | ICD-10-CM | POA: Insufficient documentation

## 2010-11-17 DIAGNOSIS — K589 Irritable bowel syndrome without diarrhea: Secondary | ICD-10-CM

## 2010-11-17 DIAGNOSIS — K5900 Constipation, unspecified: Secondary | ICD-10-CM

## 2010-11-17 DIAGNOSIS — Z8639 Personal history of other endocrine, nutritional and metabolic disease: Secondary | ICD-10-CM

## 2010-11-17 DIAGNOSIS — A048 Other specified bacterial intestinal infections: Secondary | ICD-10-CM

## 2010-11-17 DIAGNOSIS — K625 Hemorrhage of anus and rectum: Secondary | ICD-10-CM

## 2010-11-17 MED ORDER — DIPHENHYDRAMINE HCL 25 MG PO CAPS: 25.0000 mg | ORAL_CAPSULE | Freq: Once | ORAL | Status: DC

## 2010-11-17 MED ORDER — ACETAMINOPHEN 325 MG PO TABS: 650.0000 mg | ORAL_TABLET | Freq: Once | ORAL | Status: DC

## 2010-11-21 NOTE — Progress Notes (Signed)
Path cc to PCP and 5 yr reminder is in the computer

## 2010-11-22 ENCOUNTER — Encounter: Payer: Self-pay | Admitting: General Practice

## 2010-11-25 ENCOUNTER — Telehealth: Payer: Self-pay

## 2010-11-25 NOTE — Telephone Encounter (Signed)
Please call pt. He will occasionally have abd pain. He should follow a dairy free/low residue diet. Use Tylenol prn for pain. He should schedule his capsule endoscopy to evaluate blood seen in his terminal ileum.

## 2010-11-25 NOTE — Telephone Encounter (Signed)
Pt called and said that he woke up about 6:00 AM this morning with pain in his ribs and upper abd and lower abd. York Spaniel it feels like someone is pushing very hard . York Spaniel it was very severe earlier, but has calmed down a little now. He said it is not really chest pain, but he wants to know what he should do. No nausea or vomiting. I did tell him if it got severe to go to the ER, but I would let Dr. Darrick Penna know. Please advise!

## 2010-11-25 NOTE — Telephone Encounter (Signed)
Pt informed, Capsule study is scheduled for 12/06/2010.

## 2010-12-05 ENCOUNTER — Encounter: Payer: Medicaid Other | Admitting: Gastroenterology

## 2010-12-05 ENCOUNTER — Ambulatory Visit (HOSPITAL_COMMUNITY)
Admission: RE | Admit: 2010-12-05 | Discharge: 2010-12-05 | Disposition: A | Discharge status: Home / Self Care | Payer: Medicaid Other | Source: Ambulatory Visit | Attending: Gastroenterology | Admitting: Gastroenterology

## 2010-12-05 ENCOUNTER — Encounter (HOSPITAL_COMMUNITY)
Admission: RE | Disposition: A | Discharge status: Home / Self Care | Payer: Self-pay | Source: Ambulatory Visit | Attending: Gastroenterology

## 2010-12-05 DIAGNOSIS — K633 Ulcer of intestine: Secondary | ICD-10-CM | POA: Insufficient documentation

## 2010-12-05 DIAGNOSIS — K921 Melena: Secondary | ICD-10-CM | POA: Insufficient documentation

## 2010-12-05 DIAGNOSIS — R197 Diarrhea, unspecified: Secondary | ICD-10-CM | POA: Insufficient documentation

## 2010-12-05 HISTORY — PX: GIVENS CAPSULE STUDY: SHX5432

## 2010-12-05 SURGERY — IMAGING PROCEDURE, GI TRACT, INTRALUMINAL, VIA CAPSULE
Anesthesia: LOCAL

## 2010-12-06 ENCOUNTER — Encounter: Payer: Medicaid Other | Admitting: Gastroenterology

## 2010-12-06 ENCOUNTER — Telehealth: Payer: Self-pay | Admitting: Gastroenterology

## 2010-12-06 DIAGNOSIS — K5000 Crohn's disease of small intestine without complications: Secondary | ICD-10-CM

## 2010-12-06 MED ORDER — MESALAMINE ER 500 MG PO CPCR: 500.0000 mg | ORAL_CAPSULE | Freq: Four times a day (QID) | ORAL | Status: DC

## 2010-12-06 MED ORDER — PREDNISONE 10 MG PO TABS: ORAL_TABLET | ORAL | Status: DC

## 2010-12-06 NOTE — Progress Notes (Signed)
error 

## 2010-12-06 NOTE — Telephone Encounter (Signed)
Called pt RE: CE. Discussed CE results. Pt needs Prednisone taper and start Pentasa. Pt will call back for IBD website: [email or web address]. Med side effects discussed to include rash, agitation, increased appetite, and insomnia. Pt lost 13 lbs intentionally. OPV in SEP 2012, Dx: Crohn's ileitis, e:30 visit. Needs [facility] IBD panel. Discussed management options. Pt declined referral to [facility]. Agrees to see [facility] if he does not respond to therapy.

## 2010-12-06 NOTE — Brief Op Note (Signed)
12/05/2010  3:57 PM  PATIENT:  Frank Lucero  30 y.o. male  PRE-OPERATIVE DIAGNOSIS:  bloody diarrhea  POST-OPERATIVE DIAGNOSIS:  ULCERS IN TERMINAL ILEUM, LIKELY 2o TO CROHN'S DISEASE  PROCEDURE:  Procedure(s): GIVENS CAPSULE STUDY  SURGEON:  Surgeon(s): Arlyce Harman, MD  PATIENT DATA: 310 LBS, 76 IN, WAIST: 39 IN, GASTRIC PASSAGE TIME: 1 HR 5 m, SB PASSAGE TIME: 4H 35m  RESULTS: LIMITED views of gastric mucosa due to retained contents. No blood in the stomach. Multiple ulcers seen in terminal ileum begining 84% of SB PT and extending to the IC valve (98%). Occasional small amount of red blood seen. No masses or AVMs. LIMITED VIEWS OF THE COLON DUE TO RETAINED CONTENTS.  DIAGNOSIS: Probable SB Crohn's  Plan: 1. Steroid taper & Pentasa. 2, [facility] IBD panel. 3. Consider DBE if Sx not improved.

## 2010-12-07 ENCOUNTER — Other Ambulatory Visit: Payer: Self-pay

## 2010-12-07 ENCOUNTER — Telehealth: Payer: Self-pay | Admitting: General Practice

## 2010-12-07 ENCOUNTER — Telehealth: Payer: Self-pay

## 2010-12-07 NOTE — Telephone Encounter (Signed)
Pt wants to have referral to [facility].  He wanted to should he take the meds he was prescribed by Dr Darrick Penna for Crohns or should he wait until he goes to his [facility] appt.  Please advise?

## 2010-12-07 NOTE — Telephone Encounter (Signed)
I called Mr Frank Lucero and gave him to recommendations listed below from [place].

## 2010-12-07 NOTE — Telephone Encounter (Signed)
SCHEDULED PT FOR SEPT 2012 IN 30 MIN SLOT

## 2010-12-07 NOTE — Telephone Encounter (Signed)
He should follow [facility] instructions until he gets to [facility]. Let him know it usually takes several weeks to get an appt there. Thanks

## 2010-12-07 NOTE — Telephone Encounter (Signed)
I called pt to let him know that I will have to wait til Dr. Darrick Penna returns on Mon to order the [facility] labs, there are so many to choose from on the order sheet, and so expensive. He informed me that he has called and asked for the referral to [facility]. So I will find out from Dr. Darrick Penna if he will need this lab now if he is being referred.

## 2010-12-08 NOTE — Telephone Encounter (Signed)
Pt needs referral for DBE, Dx: terminal ileum ulcers. If he has questions he can come in to the office to discuss, E: 30 visit.

## 2010-12-08 NOTE — Telephone Encounter (Signed)
Pt needs [facility] IBD panel to check serologies.

## 2010-12-08 NOTE — Telephone Encounter (Signed)
Please call pt. He should start his meds.

## 2010-12-08 NOTE — Telephone Encounter (Signed)
I spoke with the pt and relayed the information given from Dr Darrick Penna.  Faxed referral the [facility].

## 2010-12-08 NOTE — Telephone Encounter (Signed)
Pt aware. Lab order at front for pick up.

## 2010-12-14 NOTE — Op Note (Signed)
NAME:  Frank Lucero, Frank Lucero NO.:  [id number]  MEDICAL RECORD NO.:  [id number]  LOCATION:  DAYP                          FACILITY:  [facility]  PHYSICIAN:  Jonette Eva, M.D.     DATE OF BIRTH:  03-24-1981  DATE OF PROCEDURE:  11/17/2010 DATE OF DISCHARGE:                              OPERATIVE REPORT   REFERRING PHYSICIAN:  Juliette Alcide, MD  PROCEDURE:  Ileocolonoscopy with cold forceps biopsy of the terminal ileum and colon.  INDICATION FOR EXAM:  Frank Lucero is a 30 year old male who was initially seen by me in February 2011, for nausea, vomiting, and epigastric pain.  Frank Lucero had an upper endoscopy with high doses of Demerol, Versed, and Phenergan, and required Phenergan.  EGD revealed gastritis. Frank Lucero was seen in followup after adding omeprazole and had no complaints. Frank Lucero was seen in September 2011, and is encouraged to lose weight because Frank Lucero has nonalcoholic steatohepatitis.  In May 2012, Frank Lucero presented with the complaint of diarrhea.  Frank Lucero previously had constipation.  Frank Lucero met the [place] criteria for irritable bowel and was given Levsin, fiber, and Align, and a followup appointment.  His stool studies checked and a Clostridium difficile was negative as well as his Giardia antigen.  His last visit was in June 2012 and Frank Lucero is still complaining of diarrhea.  Frank Lucero did not take the Levsin.  Frank Lucero was seeing blood in his stool every other day.  Frank Lucero told this bowel movement started after rectal exam.  Frank Lucero was noted to be 311 pounds.  Colonoscopy was scheduled today with propofol due to agitation during EGD in spite of high dose of Demerol, Versed.  Frank Lucero denies taking aspirin, BC's, Goody powders, ibuprofen, Motrin, or Aleve.  FINDINGS: 1. Active oozing noted in the distal terminal ileum at multiple sites.     Frank Lucero appeared to have areas of erythema and edema which were friable.     Frank Lucero also had small terminal ileum ulcers (1 mm).  Biopsies were     obtained via cold forceps. 2.  Normal-appearing colon without evidence of polyps, masses,     inflammatory changes, diverticula, or AVMs.  Random cold biopsies     obtained via cold forceps to evaluate for quiescent inflammatory     bowel disease or microscopic colitis as an etiology for his chronic     diarrhea. 3. Small internal hemorrhoids.  Otherwise, normal retroflexed view of     the rectum.  DIAGNOSES: 1. Ulcers in the terminal ileum, etiology unclear - biopsies pending. 2. Normal colon. 3. Small internal hemorrhoids.  RECOMMENDATIONS: 1. Frank Lucero should be on a low-residue diet until his biopsies return. 2. Frank Lucero should avoid aspirin and antiinflammatory drugs. 3. We will call him with results of his biopsies. 4. Frank Lucero already has a followup appointment to see me in 3 months.  MEDICATIONS:  Propofol provided by Anesthesia.  PROCEDURE TECHNIQUE:  Physical exam was performed.  Informed consent was obtained from the patient after explaining the benefits, risks, and alternatives to the procedure.  The patient was connected to monitor and placed in the left lateral position.  Continuous oxygen was provided by nasal cannula and IV medicine was administered through an  indwelling cannula.  After administration of sedation and rectal exam, the patient's rectum was intubated and the scope was advanced under direct visualization to the distal terminal ileum.  The scope was removed slowly by carefully examining the color, texture, anatomy, and integrity of the mucosa on the way out.  The patient was recovered in endoscopy and discharged home in satisfactory condition.  PATH: NL TI AND COLON   Jonette Eva, M.D.     SF/MEDQ  D:  11/17/2010  T:  11/18/2010  Job:  [id number]  cc:   Juliette Alcide, MD  Electronically Signed by Jonette Eva M.D. on 12/14/2010 12:42:45 PM

## 2010-12-20 ENCOUNTER — Telehealth: Payer: Self-pay | Admitting: General Practice

## 2010-12-20 NOTE — Telephone Encounter (Signed)
I called the pt to give him his [facility] appt 01/25/2011@10 :00am

## 2010-12-21 ENCOUNTER — Encounter (HOSPITAL_COMMUNITY): Payer: Self-pay | Admitting: Gastroenterology

## 2010-12-28 ENCOUNTER — Telehealth: Payer: Self-pay | Admitting: Gastroenterology

## 2010-12-28 NOTE — Telephone Encounter (Signed)
Results Cc to PCP and will be faxed to [facility] when DBE is scheduled

## 2010-12-28 NOTE — Telephone Encounter (Signed)
Please call pt. His IBD panel is NOT consistent with Crohn's disease. He needs DBE at [place]. We will FAX RESULTS TO [facility]. He should stop the mesalamine and use Imodium prn for diarrhea. Continue the Imipramine. He should taper off the steroids as he was previously instructed. OPV in late SEP 2012.

## 2011-01-02 NOTE — Telephone Encounter (Signed)
Pt was informed. York Spaniel he has an appt for a consult at [facility] on 01/25/2011.

## 2011-01-02 NOTE — Telephone Encounter (Signed)
Pt has OV on 02/01/11 @ 1000 with [facility]

## 2011-01-02 NOTE — Telephone Encounter (Signed)
Routed to DS 

## 2011-01-03 ENCOUNTER — Encounter: Payer: Self-pay | Admitting: Gastroenterology

## 2011-01-05 ENCOUNTER — Ambulatory Visit: Payer: Medicaid Other | Admitting: Gastroenterology

## 2011-02-01 ENCOUNTER — Encounter: Payer: Self-pay | Admitting: Gastroenterology

## 2011-02-01 ENCOUNTER — Ambulatory Visit (INDEPENDENT_AMBULATORY_CARE_PROVIDER_SITE_OTHER): Payer: Medicaid Other | Admitting: Gastroenterology

## 2011-02-01 ENCOUNTER — Ambulatory Visit: Payer: Medicaid Other | Admitting: Gastroenterology

## 2011-02-01 VITALS — BP 130/82 | HR 70 | Temp 98.0°F | Ht 76.0 in | Wt 304.4 lb

## 2011-02-01 DIAGNOSIS — R197 Diarrhea, unspecified: Secondary | ICD-10-CM

## 2011-02-01 DIAGNOSIS — K5000 Crohn's disease of small intestine without complications: Secondary | ICD-10-CM

## 2011-02-01 NOTE — Progress Notes (Signed)
Primary Care Physician: Juliette Alcide, [place]  Primary Gastroenterologist:  Jonette Eva, MD   Chief Complaint  Patient presents with  . Follow-up    HPI: Frank Lucero is a 30 y.o. male here for f/u. History of terminal ileal ulcers seen at time of colonoscopy with ileoscopy. Biopsies negative. SB capsule endoscopy showed multiple ulcers in terminal ileum extending into ICV. [facility] IBD panel not c/w IBD. Patient did receive short-term mesalamine and prednisone after SB capsule endoscopy but this was stopped after [facility] labs not c/w IBD. Patient was scheduled for DBE at [facility] but missed appointment due to diarrhea the morning of his appt. He is scheduled for November.   New rash on face, present for two weeks. Macular without pustules. Mostly on right face. Had bumps on upper torso but these have resolved. C/O ongoing lower abdominal pain, mild, achy, no better with BM, no worse with eating. Usually just one BM daily but intermittent diarrhea. Diarrhea calmed down some at this point. No daily. No melena. Some brbpr on toilet tissue. Appetite good. No n/v. No heartburn. Off prednisone for four weeks.  Did not try Levsin or imipramine. States he got meds filled but just didn't start taking.   Current Outpatient Prescriptions  Medication Sig Dispense Refill  . omeprazole (PRILOSEC) 20 MG capsule Take 20 mg by mouth daily.        . hyoscyamine (LEVSIN/SL) 0.125 MG SL tablet Place 1 tablet (0.125 mg total) under the tongue 4 (four) times daily -  before meals and at bedtime.      Marland Kitchen imipramine (TOFRANIL) 10 MG tablet 1 po qhs for 7 days then 2 po qhs for 7 days then 3 po qhs  90 tablet  11    Allergies as of 02/01/2011  . (No Known Allergies)    ROS:  General: Negative for anorexia, weight loss, fever, chills, fatigue, weakness. ENT: Negative for hoarseness, difficulty swallowing , nasal congestion. CV: Negative for chest pain, angina, palpitations, dyspnea on exertion, peripheral  edema.  Respiratory: Negative for dyspnea at rest, dyspnea on exertion, cough, sputum, wheezing.  GI: See history of present illness. GU:  Negative for dysuria, hematuria, urinary incontinence, urinary frequency, nocturnal urination.  Endo: Negative for unusual weight change.    Physical Examination:   BP 130/82  Pulse 70  Temp(Src) 98 F (36.7 C) (Temporal)  Ht 6\' 4"  (1.93 m)  Wt 304 lb 6.4 oz (138.075 kg)  BMI 37.05 kg/m2  General: Well-nourished, well-developed in no acute distress.  Eyes: No icterus. Mouth: Oropharyngeal mucosa moist and pink , no lesions erythema or exudate. Lungs: Clear to auscultation bilaterally.  Heart: Regular rate and rhythm, no murmurs rubs or gallops.  Abdomen: Bowel sounds are normal, nontender, nondistended, no hepatosplenomegaly or masses, no abdominal bruits or hernia , no rebound or guarding.   Extremities: No lower extremity edema. No clubbing or deformities. Neuro: Alert and oriented x 4   Skin: Warm and dry, no jaundice. Several erythematous, macular lesions noted on right face just right of nose.   Psych: Alert and cooperative, normal mood and affect.

## 2011-02-01 NOTE — Patient Instructions (Signed)
Please call in two weeks if you notice no improvement with your diarrhea on levsin. Please keep appointment at [facility] in 03/2011. Call with any questions or concerns. If your facial rash does not clear in next few days, please contact your family doctor.

## 2011-02-02 ENCOUNTER — Encounter: Payer: Self-pay | Admitting: Gastroenterology

## 2011-02-02 NOTE — Assessment & Plan Note (Addendum)
Diarrhea better but intermittent bouts of diarrhea that keeps him from leaving house. Missed appt at [facility] b/c diarrhea. Rescheduled for 03/2011. At this point, it is thought that patient does not have Crohn's based on negative biopsies and normal IBD panel. Etiology of terminal ileitis unknown, without h/o significant nsaid/asa use. Interestingly, patient states diarrhea better at this point, not as frequent. Off Prednisone for one month.   Treat diarrhea and abd pain with antispasmotics and imipramine for now. Keep appt at [facility]. He will call if persistent or worsening symptoms.  Facial rash, unclear etiology. Should see PCP next week and get dermatology referral if persistent.

## 2011-02-02 NOTE — Progress Notes (Signed)
Cc to PCP 

## 2011-02-08 NOTE — Progress Notes (Signed)
See addendum by Dr. Darrick Penna. Schedule OV with Dr. Darrick Penna only E:30 visit after he has appt at [facility]. Will need op notes from [facility] before OV with Dr. Darrick Penna.

## 2011-02-08 NOTE — Progress Notes (Signed)
AGREE. OPV IN DEC 2012 W/ [facility] E:30 VISIT after appt at [facility]. WILL Need op note prior to OPV.

## 2011-02-08 NOTE — Progress Notes (Signed)
Cc to PCP 

## 2011-03-11 ENCOUNTER — Other Ambulatory Visit: Payer: Self-pay | Admitting: Gastroenterology

## 2011-12-10 ENCOUNTER — Other Ambulatory Visit: Payer: Self-pay | Admitting: Gastroenterology

## 2011-12-11 NOTE — Telephone Encounter (Signed)
Was seen Sept 2012. Was supposed to come back Dec 2012 to see [facility].  Please arrange appt with [facility] only. I will refill imipramine for 1 month.

## 2011-12-12 ENCOUNTER — Other Ambulatory Visit: Payer: Self-pay | Admitting: Gastroenterology

## 2011-12-18 ENCOUNTER — Encounter: Payer: Self-pay | Admitting: Gastroenterology

## 2011-12-18 NOTE — Telephone Encounter (Signed)
Pt is aware of OV on 9/4 @ 9 am with SF and appt card was mailed

## 2012-01-23 ENCOUNTER — Encounter: Payer: Self-pay | Admitting: Gastroenterology

## 2012-01-24 ENCOUNTER — Encounter: Payer: Self-pay | Admitting: Gastroenterology

## 2012-01-24 ENCOUNTER — Ambulatory Visit (INDEPENDENT_AMBULATORY_CARE_PROVIDER_SITE_OTHER): Payer: Medicaid Other | Admitting: Gastroenterology

## 2012-01-24 VITALS — BP 122/76 | HR 77 | Temp 98.4°F | Ht 76.0 in | Wt 303.0 lb

## 2012-01-24 DIAGNOSIS — K7689 Other specified diseases of liver: Secondary | ICD-10-CM

## 2012-01-24 DIAGNOSIS — Z862 Personal history of diseases of the blood and blood-forming organs and certain disorders involving the immune mechanism: Secondary | ICD-10-CM

## 2012-01-24 DIAGNOSIS — Z8639 Personal history of other endocrine, nutritional and metabolic disease: Secondary | ICD-10-CM

## 2012-01-24 DIAGNOSIS — R197 Diarrhea, unspecified: Secondary | ICD-10-CM

## 2012-01-24 DIAGNOSIS — K219 Gastro-esophageal reflux disease without esophagitis: Secondary | ICD-10-CM

## 2012-01-24 MED ORDER — DIETHYLPROPION HCL 25 MG PO TABS: ORAL_TABLET | ORAL | Status: DC

## 2012-01-24 MED ORDER — IMIPRAMINE HCL 10 MG PO TABS: ORAL_TABLET | ORAL | Status: DC

## 2012-01-24 NOTE — Assessment & Plan Note (Signed)
WEIGHT UNCHANGED.

## 2012-01-24 NOTE — Assessment & Plan Note (Signed)
SX CONTROLLED W/O MEDS.  OPV IN 1 YEAR.

## 2012-01-24 NOTE — Progress Notes (Signed)
Reminder in epic to follow up with SF in E30 in one year °

## 2012-01-24 NOTE — Assessment & Plan Note (Addendum)
RARE. LIKELY DUE TO IBS.  SX BETTER WITH IMIPRAMINE QHS. OPV IN 1 YEAR.

## 2012-01-24 NOTE — Progress Notes (Signed)
Faxed to PCP

## 2012-01-24 NOTE — Patient Instructions (Signed)
TAKE TENUATE TO SUPPRESS YOUR APPETITE.  CONTINUE IMIPRAMINE.  FOLLOW UP IN 2 MOS TO RECHECK WEIGHT. CALL IF YOU EXPERIENCE SIDE EFFECTS.      Tenuate side effects Get emergency medical help if you have any of these signs of an allergic reaction to Tenuate: hives; difficult breathing; swelling of your face, lips, tongue, or throat.  Stop using Tenuate and call your doctor at once if you have a serious side effect such as: fast, pounding, or uneven heartbeats; chest pain, feeling short of breath (even with mild exertion); feeling like you might pass out; swelling in your ankles or feet; confusion, hallucinations, unusual thoughts or behavior; seizure (convulsions); muscle movements you cannot control; or sudden numbness or weakness, especially on one side of the body. Less serious Tenuate side effects may include: nausea, vomiting, diarrhea, upset stomach; headache, blurred vision; feeling nervous, anxious, or jittery; sleep problems (insomnia); dizziness, drowsiness, tired feeling; depressed mood; dry mouth, unpleasant taste in your mouth; decreased sex drive; or mild itching or rash.

## 2012-01-24 NOTE — Progress Notes (Signed)
  Subjective:    Patient ID: Frank Lucero, male    DOB: [birth year], 31 y.o.   MRN: [id number]  PCP: Leandrew Koyanagi  HPI Occasional diarrhea. NO BLOOD IN STOOL. BMs: ONCE, NL. USES IBUPROFEN-ONCE A MO FOR HEADACHES. NO BC OR GOODY'S. NO ETOH. EXPOSED TO SUN-STOMACH STARTS TO CRAMP AND FEEL SLIKE HE HAS TO GO TO BM AND HAS DIARRHEA: 2X/MO, JUST ONE EPISODE. Made trip to [facility]. NO PROCEDURE DONE. TAKING IMIPRAMINE AND sX. NO HEARTBURN AND NOT TAKING OMP.  Past Medical History  Diagnosis Date  . GERD (gastroesophageal reflux disease) 06/25/09    EGD Dr Darrick Penna small HH, mild gastritis, duodenitis  . Obesity   . Helicobacter pylori gastritis 06/2009    (SEROLOGY POS [facility]/ s/p treatment ), Bx NEG FEB 2011  . NASH (nonalcoholic steatohepatitis) JAN 2011 305 LBS, BMI 37.14 Aug 2009 ABD U/S-FATTY LIVER    Past Surgical History  Procedure Date  . Benign tumor     Right leg as a child  . Upper gastrointestinal endoscopy FEB 2011 [place], [facility]    HH, CHRONIC GASTRITIS, DUODENITIS  . Givens capsule study 12/05/2010    Procedure: GIVENS CAPSULE STUDY;  Surgeon: Arlyce Harman, MD;  Location: [facility];  Service: Endoscopy;  Laterality: N/A;. multiple ulcers in TI into ICV, occasional small amount of red blood  . Colonoscopy 10/2010    active oozing distal terminal ileum at multiple sites, areas of erythema and edema friable, small TI ulcers (1mm), bx neg. Random colon bx negative, small internal hemorrhoids. Promethus IBD panel not c/w IBD.   No Known Allergies  Current Outpatient Prescriptions  Medication Sig Dispense Refill  .        . imipramine (TOFRANIL) 10 MG tablet  TAKE THREE TABLETS AT BEDTIME    .      Marland Kitchen          Review of Systems     Objective:   Physical Exam  Vitals reviewed. Constitutional: He is oriented to person, place, and time. He appears well-nourished. No distress.  HENT:  Head: Normocephalic and atraumatic.  Mouth/Throat: Oropharynx is clear and moist.  Eyes: Pupils  are equal, round, and reactive to light. No scleral icterus.  Neck: Normal range of motion. Neck supple.  Cardiovascular: Normal rate, regular rhythm and normal heart sounds.   Pulmonary/Chest: Effort normal and breath sounds normal.  Abdominal: Soft. Bowel sounds are normal. He exhibits no distension. There is no tenderness.  Neurological: He is alert and oriented to person, place, and time.       NO FOCAL DEFICITS   Psychiatric:       NL MOOD, FLAT AFFECT          Assessment & Plan:

## 2012-01-24 NOTE — Assessment & Plan Note (Addendum)
FATTY LIVER DISEASE WEIGHT UNCHANGED WITH DIET MODIFICATION.  ADD TENUATE TO ASSIST WITH WEIGHT LOSS. ,ED SIDE EFFECTA DISCUSSED. HO GIVEN. RX GIVEN FOR 9/4 AND 10/4.  OPV IN 2 MOS.

## 2012-04-23 ENCOUNTER — Telehealth: Payer: Self-pay | Admitting: Gastroenterology

## 2012-04-23 NOTE — Telephone Encounter (Signed)
Pt called this afternoon asking if [facility] would write him a prescription for an appetite suppressant. I told him that was probably something his PCP would need to do, but patient said that [facility] had given him a prescription before for this. [phone]

## 2012-04-24 MED ORDER — DIETHYLPROPION HCL 25 MG PO TABS: ORAL_TABLET | ORAL | Status: DC

## 2012-04-24 NOTE — Telephone Encounter (Signed)
Routing to Dr. Fields.  

## 2012-04-24 NOTE — Telephone Encounter (Signed)
PLEASE CALL PT.  HE MAY PICK UP HIS RX. HE NEEDS AN APPT JAN 2014 E 30 NASH.

## 2012-04-24 NOTE — Telephone Encounter (Signed)
Pt is aware of OV in January

## 2012-04-24 NOTE — Telephone Encounter (Signed)
Called pt and told him to come by and pick up the prescriptions. One for 04/24/2012 and one for 05/22/2012. I told him to keep the one for Jan and take it to the pharmacy then. He will make appt for OV in Jan when he comes by office.

## 2012-06-12 ENCOUNTER — Encounter: Payer: Self-pay | Admitting: Gastroenterology

## 2012-06-13 ENCOUNTER — Ambulatory Visit (INDEPENDENT_AMBULATORY_CARE_PROVIDER_SITE_OTHER): Payer: Medicaid Other | Admitting: Gastroenterology

## 2012-06-13 ENCOUNTER — Encounter: Payer: Self-pay | Admitting: Gastroenterology

## 2012-06-13 VITALS — BP 141/80 | HR 98 | Temp 98.4°F | Ht 75.0 in | Wt 287.8 lb

## 2012-06-13 DIAGNOSIS — R159 Full incontinence of feces: Secondary | ICD-10-CM

## 2012-06-13 DIAGNOSIS — K625 Hemorrhage of anus and rectum: Secondary | ICD-10-CM

## 2012-06-13 DIAGNOSIS — K7689 Other specified diseases of liver: Secondary | ICD-10-CM

## 2012-06-13 DIAGNOSIS — K589 Irritable bowel syndrome without diarrhea: Secondary | ICD-10-CM

## 2012-06-13 MED ORDER — HYDROCORTISONE ACETATE 25 MG RE SUPP: 25.0000 mg | Freq: Two times a day (BID) | RECTAL | Status: DC
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
## 2012-06-13 NOTE — Patient Instructions (Signed)
CONTINUE WEIGHT LOSS EFFORTS.  TAKE ANUSOL SUPPOSITORIES TWICE DAILY FOR 7 DAYS.  FOLLOW UP IN 2 MOS.

## 2012-06-13 NOTE — Assessment & Plan Note (Addendum)
C/O SOILING WITH GETTING HOT OR WORKING AROUND THE HOUSE. FEELS LIKELY SOMETHING COMING OUT OF HIS RECTUM. MOST LIKELY DUE TO HEMORRHOIDS, DIFFERENTIAL DIAGNOSIS INCLUDES RECTAL PROLAPSE, LESS LIKELY OVERFLOW INCONTINENCE OR FISTULIZING CROHN'S DISEASE.Marland Kitchen  ANUSOL HC BID FOR 12 DAYS. OPV IN 2 MOS. WILL PERFORM RECTAL EXAM WITH VALSALVA IF SX NOT IMPROVED.

## 2012-06-13 NOTE — Assessment & Plan Note (Addendum)
DUE TO HEMORRHOIDS.  ANUSOL HC BID FOR 12 DAYS

## 2012-06-13 NOTE — Assessment & Plan Note (Signed)
SX IMPROVED WITH  IMIPRAMINE.  CONTINUE MEDS.

## 2012-06-13 NOTE — Assessment & Plan Note (Signed)
LOST 13 LBS SINCE SEP 2013.  ENCOURAGED CONTINUED WEIGHT LOSS EFFORTS. RECHECK HFP AFTER NEXT VISIT.

## 2012-07-25 ENCOUNTER — Ambulatory Visit (INDEPENDENT_AMBULATORY_CARE_PROVIDER_SITE_OTHER): Payer: Medicaid Other | Admitting: Gastroenterology

## 2012-07-25 ENCOUNTER — Encounter: Payer: Self-pay | Admitting: Gastroenterology

## 2012-07-25 VITALS — BP 132/84 | HR 86 | Temp 98.0°F | Ht 76.0 in | Wt 304.6 lb

## 2012-07-25 DIAGNOSIS — K625 Hemorrhage of anus and rectum: Secondary | ICD-10-CM

## 2012-07-25 DIAGNOSIS — K7689 Other specified diseases of liver: Secondary | ICD-10-CM

## 2012-07-25 MED ORDER — DIETHYLPROPION HCL 25 MG PO TABS: ORAL_TABLET | ORAL | Status: DC

## 2012-07-25 NOTE — Assessment & Plan Note (Addendum)
FAIRLY WELL CONTROLED W/O MEDS.

## 2012-07-25 NOTE — Patient Instructions (Signed)
YOU NEED YOUR LIVER ENZYMES CHECKED.  CONTINUE WEIGHT LOSS EFFORTS.  FOLLOW A LOW FAT DIET. SEE INFO BELOW.  TRY TENUATE AGAIN RO ASSIST WITH WEIGHT LOSS EFFORTS. MED SIDE EFFECT HO GIVEN.  FOLLOW UP IN 2 MOS.   SEE DR. Leandrew Koyanagi FOR DIFFICULTY SLEEPING.  Low-Fat Diet BREADS, CEREALS, PASTA, RICE, DRIED PEAS, AND BEANS These products are high in carbohydrates and most are low in fat. Therefore, they can be increased in the diet as substitutes for fatty foods. They too, however, contain calories and should not be eaten in excess. Cereals can be eaten for snacks as well as for breakfast.   FRUITS AND VEGETABLES It is good to eat fruits and vegetables. Besides being sources of fiber, both are rich in vitamins and some minerals. They help you get the daily allowances of these nutrients. Fruits and vegetables can be used for snacks and desserts.  MEATS Limit lean meat, chicken, [place], and fish to no more than 6 ounces per day. Beef, Pork, and Lamb Use lean cuts of beef, pork, and lamb. Lean cuts include:  Extra-lean ground beef.  Arm roast.  Sirloin tip.  Center-cut ham.  Round steak.  Loin chops.  Rump roast.  Tenderloin.  Trim all fat off the outside of meats before cooking. It is not necessary to severely decrease the intake of red meat, but lean choices should be made. Lean meat is rich in protein and contains a highly absorbable form of iron. Premenopausal women, in particular, should avoid reducing lean red meat because this could increase the risk for low red blood cells (iron-deficiency anemia).  Chicken and [place] These are good sources of protein. The fat of poultry can be reduced by removing the skin and underlying fat layers before cooking. Chicken and [place] can be substituted for lean red meat in the diet. Poultry should not be fried or covered with high-fat sauces. Fish and Shellfish Fish is a good source of protein. Shellfish contain cholesterol, but they usually are low  in saturated fatty acids. The preparation of fish is important. Like chicken and [place], they should not be fried or covered with high-fat sauces. EGGS Egg whites contain no fat or cholesterol. They can be eaten often. Try 1 to 2 egg whites instead of whole eggs in recipes or use egg substitutes that do not contain yolk. MILK AND DAIRY PRODUCTS Use skim or 1% milk instead of 2% or whole milk. Decrease whole milk, natural, and processed cheeses. Use nonfat or low-fat (2%) cottage cheese or low-fat cheeses made from vegetable oils. Choose nonfat or low-fat (1 to 2%) yogurt. Experiment with evaporated skim milk in recipes that call for heavy cream. Substitute low-fat yogurt or low-fat cottage cheese for sour cream in dips and salad dressings. Have at least 2 servings of low-fat dairy products, such as 2 glasses of skim (or 1%) milk each day to help get your daily calcium intake. FATS AND OILS Reduce the total intake of fats, especially saturated fat. Butterfat, lard, and beef fats are high in saturated fat and cholesterol. These should be avoided as much as possible. Vegetable fats do not contain cholesterol, but certain vegetable fats, such as coconut oil, palm oil, and palm kernel oil are very high in saturated fats. These should be limited. These fats are often used in bakery goods, processed foods, popcorn, oils, and nondairy creamers. Vegetable shortenings and some peanut butters contain hydrogenated oils, which are also saturated fats. Read the labels on these foods and check  for saturated vegetable oils. Unsaturated vegetable oils and fats do not raise blood cholesterol. However, they should be limited because they are fats and are high in calories. Total fat should still be limited to 30% of your daily caloric intake. Desirable liquid vegetable oils are corn oil, cottonseed oil, olive oil, canola oil, safflower oil, soybean oil, and sunflower oil. Peanut oil is not as good, but small amounts are  acceptable. Buy a heart-healthy tub margarine that has no partially hydrogenated oils in the ingredients. Mayonnaise and salad dressings often are made from unsaturated fats, but they should also be limited because of their high calorie and fat content. Seeds, nuts, peanut butter, olives, and avocados are high in fat, but the fat is mainly the unsaturated type. These foods should be limited mainly to avoid excess calories and fat. OTHER EATING TIPS Snacks  Most sweets should be limited as snacks. They tend to be rich in calories and fats, and their caloric content outweighs their nutritional value. Some good choices in snacks are graham crackers, melba toast, soda crackers, bagels (no egg), English muffins, fruits, and vegetables. These snacks are preferable to snack crackers, [place] fries, TORTILLA CHIPS, and POTATO chips. Popcorn should be air-popped or cooked in small amounts of liquid vegetable oil. Desserts Eat fruit, low-fat yogurt, and fruit ices instead of pastries, cake, and cookies. Sherbet, angel food cake, gelatin dessert, frozen low-fat yogurt, or other frozen products that do not contain saturated fat (pure fruit juice bars, frozen ice pops) are also acceptable.  COOKING METHODS Choose those methods that use little or no fat. They include: Poaching.  Braising.  Steaming.  Grilling.  Baking.  Stir-frying.  Broiling.  Microwaving.  Foods can be cooked in a nonstick pan without added fat, or use a nonfat cooking spray in regular cookware. Limit fried foods and avoid frying in saturated fat. Add moisture to lean meats by using water, broth, cooking wines, and other nonfat or low-fat sauces along with the cooking methods mentioned above. Soups and stews should be chilled after cooking. The fat that forms on top after a few hours in the refrigerator should be skimmed off. When preparing meals, avoid using excess salt. Salt can contribute to raising blood pressure in some people.  EATING  AWAY FROM HOME Order entres, potatoes, and vegetables without sauces or butter. When meat exceeds the size of a deck of cards (3 to 4 ounces), the rest can be taken home for another meal. Choose vegetable or fruit salads and ask for low-calorie salad dressings to be served on the side. Use dressings sparingly. Limit high-fat toppings, such as bacon, crumbled eggs, cheese, sunflower seeds, and olives. Ask for heart-healthy tub margarine instead of butter.

## 2012-07-25 NOTE — Progress Notes (Signed)
  Subjective:    Patient ID: Frank Lucero, male    DOB: 1981/02/17, 32 y.o.   MRN: [id number]  PCP: Leandrew Koyanagi  HPI FEELING BETTER. NO CONCERNS. HASN'T BEEN IN A SITUATION TO  HAVE SOILING WITH GETTING HOT OR WORKING AROUND THE HOUSE. FEELS LIKELY SOMETHING COMING OUT OF HIS RECTUM. FELT LIKE SOMETHING WAS COMING OUT OF HIS BOTTOM BUT NOW IT'S GONE AFTER ANUSOL. RECTAL BLEEDING HAS BEEN RESOLVED. 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 NOT TAKING WEIGHT LOSS PILL BUT IT DID WORK. WENT FROM 305 LBS TO 268 LBS. HAD DIARRHEA A COUPLE DAYS AGO, BUT NOW IT'S GONE. heartburn or indigestion IF UP AT NIGHT EATING, BUT IF HE SLEEPS DURING THE DAY IT DOESN'T HAPPEN. NO SMOKING OR ETOH.  PT DENIES FEVER, CHILLS, BRBPR, nausea, vomiting, melena, constipation, abd pain, problems swallowing,   Past Medical History  Diagnosis Date  . GERD (gastroesophageal reflux disease) 06/25/09    EGD Dr Darrick Penna small HH, mild gastritis, duodenitis  . Obesity   . Helicobacter pylori gastritis 06/2009    (SEROLOGY POS [facility]/ s/p treatment ), Bx NEG FEB 2011  . NASH (nonalcoholic steatohepatitis) JAN 2011 305 LBS, BMI 37.14 Aug 2009 ABD U/S-FATTY LIVER   Past Surgical History  Procedure Laterality Date  . Benign tumor      Right leg as a child  . Upper gastrointestinal endoscopy  FEB 2011 [place], [facility]    HH, CHRONIC GASTRITIS, DUODENITIS  . Givens capsule study  12/05/2010    SLF: NL TI AND COLON/Small internal hemorrhoids  . Colonoscopy  10/2010    active oozing distal terminal ileum at multiple sites, areas of erythema and edema friable, small TI ulcers (1mm), bx neg. Random colon bx negative, small internal hemorrhoids. Promethus IBD panel not c/w IBD.    No Known Allergies  Current Outpatient Prescriptions  Medication Sig Dispense Refill  .      . imipramine (TOFRANIL) 10 MG tablet 3 PO QHS  90 tablet  11  .           Review of Systems NO SORES IN MOUTH, RASH ON LEGS,  JOINT PAIN, OR BACK PAIN.     Objective:   Physical Exam  Vitals reviewed. Constitutional: He is oriented to person, place, and time. He appears well-nourished. No distress.  HENT:  Head: Normocephalic and atraumatic.  Right Ear: External ear normal.  Mouth/Throat: No oropharyngeal exudate.  Eyes: Pupils are equal, round, and reactive to light. No scleral icterus.  Neck: Normal range of motion. Neck supple.  Cardiovascular: Normal rate, regular rhythm and normal heart sounds.   Pulmonary/Chest: Effort normal and breath sounds normal. No respiratory distress.  Abdominal: Soft. Bowel sounds are normal. He exhibits no distension. There is no tenderness.  Musculoskeletal: He exhibits no edema.  Lymphadenopathy:    He has no cervical adenopathy.  Neurological: He is alert and oriented to person, place, and time.  NO FOCAL DEFICITS   Psychiatric:  FLAT AFFECT, NL MOOD           Assessment & Plan:

## 2012-07-25 NOTE — Assessment & Plan Note (Signed)
GAINED 17 LBS SINCE LAST VISIT. SIGNIFICANT WEIGHT LOSS WITH TENUATE.   NEEDS HFP. ENCOURAGED WEIGHT LOSS. LOW FAT DIET TRY TENUATE AGAIN. MED SIDE EFFECT HO GIVEN. OPV IN 2 MOS.

## 2012-07-25 NOTE — Assessment & Plan Note (Signed)
SX IMPROVED AFTER ANUSOL.  WILL FOLLOW.

## 2012-07-25 NOTE — Progress Notes (Signed)
Faxed to PCP

## 2012-07-25 NOTE — Assessment & Plan Note (Signed)
DUE TO HEMORRHOIDS. SX RESOLVED WITH ANUSOL.  WILL FOLLOW.

## 2012-08-09 LAB — HEPATIC FUNCTION PANEL
ALT: 25 U/L (ref 0–53)
Indirect Bilirubin: 1.2 mg/dL — ABNORMAL HIGH (ref 0.0–0.9)
Total Protein: 7.5 g/dL (ref 6.0–8.3)

## 2012-08-13 ENCOUNTER — Encounter: Payer: Self-pay | Admitting: Gastroenterology

## 2012-08-13 NOTE — Progress Notes (Signed)
Pt is aware of OV on 5/22 at 10 with [facility] and appt card was mailed 

## 2012-08-21 ENCOUNTER — Encounter: Payer: Self-pay | Admitting: Gastroenterology

## 2012-08-21 NOTE — Progress Notes (Signed)
CC PCP 

## 2012-08-21 NOTE — Progress Notes (Signed)
Called and informed pt.  

## 2012-08-21 NOTE — Progress Notes (Signed)
PLEASE CALL PT. HIS LIVER TEST REMAIN UNCHANGED.

## 2012-10-09 ENCOUNTER — Encounter: Payer: Self-pay | Admitting: Gastroenterology

## 2012-10-10 ENCOUNTER — Encounter: Payer: Self-pay | Admitting: Gastroenterology

## 2012-10-10 ENCOUNTER — Ambulatory Visit (INDEPENDENT_AMBULATORY_CARE_PROVIDER_SITE_OTHER): Payer: Medicaid Other | Admitting: Gastroenterology

## 2012-10-10 VITALS — BP 127/79 | HR 71 | Temp 98.5°F | Ht 75.0 in | Wt 298.0 lb

## 2012-10-10 DIAGNOSIS — R159 Full incontinence of feces: Secondary | ICD-10-CM

## 2012-10-10 DIAGNOSIS — K589 Irritable bowel syndrome without diarrhea: Secondary | ICD-10-CM

## 2012-10-10 NOTE — Assessment & Plan Note (Signed)
SX RESOLVED AFTER ANUSOL HC. NO ACTIVE HEMORRHOID SX.  WILL MONITOR.

## 2012-10-10 NOTE — Assessment & Plan Note (Signed)
SX CONTROLLED,  CONTINUE IMIPRAMINE UNLESS THE SIDE EFFECTS OUTWEIGH BENEFITS IN HOT ENVIRONMENTS. OPV IN 6 MOS

## 2012-10-10 NOTE — Progress Notes (Signed)
Reminder in epic °

## 2012-10-10 NOTE — Progress Notes (Signed)
Cc PCP 

## 2012-10-10 NOTE — Patient Instructions (Signed)
CONTINUE IMIPRAMINE. CALL ME IF YOU THINK YOU HARE HAVING SIDE EFFECTS [facility] MEDICINE.  DRINK WATER. AVOID KOOL-AID, SWEET TEA, JUICE, & SODA.  FOLLOW A LOW FAT/HIGH FIBER DIET. SEE INFO BELOW.  FOLLOW UP IN 6 MOS.  Low-Fat Diet BREADS, CEREALS, PASTA, RICE, DRIED PEAS, AND BEANS These products are high in carbohydrates and most are low in fat. Therefore, they can be increased in the diet as substitutes for fatty foods. They too, however, contain calories and should not be eaten in excess. Cereals can be eaten for snacks as well as for breakfast.   FRUITS AND VEGETABLES It is good to eat fruits and vegetables. Besides being sources of fiber, both are rich in vitamins and some minerals. They help you get the daily allowances of these nutrients. Fruits and vegetables can be used for snacks and desserts.  MEATS Limit lean meat, chicken, [place], and fish to no more than 6 ounces per day. Beef, Pork, and Lamb Use lean cuts of beef, pork, and lamb. Lean cuts include:  Extra-lean ground beef.  Arm roast.  Sirloin tip.  Center-cut ham.  Round steak.  Loin chops.  Rump roast.  Tenderloin.  Trim all fat off the outside of meats before cooking. It is not necessary to severely decrease the intake of red meat, but lean choices should be made. Lean meat is rich in protein and contains a highly absorbable form of iron. Premenopausal women, in particular, should avoid reducing lean red meat because this could increase the risk for low red blood cells (iron-deficiency anemia).  Chicken and [place] These are good sources of protein. The fat of poultry can be reduced by removing the skin and underlying fat layers before cooking. Chicken and [place] can be substituted for lean red meat in the diet. Poultry should not be fried or covered with high-fat sauces. Fish and Shellfish Fish is a good source of protein. Shellfish contain cholesterol, but they usually are low in saturated fatty acids. The  preparation of fish is important. Like chicken and [place], they should not be fried or covered with high-fat sauces. EGGS Egg whites contain no fat or cholesterol. They can be eaten often. Try 1 to 2 egg whites instead of whole eggs in recipes or use egg substitutes that do not contain yolk. MILK AND DAIRY PRODUCTS Use skim or 1% milk instead of 2% or whole milk. Decrease whole milk, natural, and processed cheeses. Use nonfat or low-fat (2%) cottage cheese or low-fat cheeses made from vegetable oils. Choose nonfat or low-fat (1 to 2%) yogurt. Experiment with evaporated skim milk in recipes that call for heavy cream. Substitute low-fat yogurt or low-fat cottage cheese for sour cream in dips and salad dressings. Have at least 2 servings of low-fat dairy products, such as 2 glasses of skim (or 1%) milk each day to help get your daily calcium intake. FATS AND OILS Reduce the total intake of fats, especially saturated fat. Butterfat, lard, and beef fats are high in saturated fat and cholesterol. These should be avoided as much as possible. Vegetable fats do not contain cholesterol, but certain vegetable fats, such as coconut oil, palm oil, and palm kernel oil are very high in saturated fats. These should be limited. These fats are often used in bakery goods, processed foods, popcorn, oils, and nondairy creamers. Vegetable shortenings and some peanut butters contain hydrogenated oils, which are also saturated fats. Read the labels on these foods and check for saturated vegetable oils. Unsaturated vegetable oils and fats  do not raise blood cholesterol. However, they should be limited because they are fats and are high in calories. Total fat should still be limited to 30% of your daily caloric intake. Desirable liquid vegetable oils are corn oil, cottonseed oil, olive oil, canola oil, safflower oil, soybean oil, and sunflower oil. Peanut oil is not as good, but small amounts are acceptable. Buy a heart-healthy tub  margarine that has no partially hydrogenated oils in the ingredients. Mayonnaise and salad dressings often are made from unsaturated fats, but they should also be limited because of their high calorie and fat content. Seeds, nuts, peanut butter, olives, and avocados are high in fat, but the fat is mainly the unsaturated type. These foods should be limited mainly to avoid excess calories and fat. OTHER EATING TIPS Snacks  Most sweets should be limited as snacks. They tend to be rich in calories and fats, and their caloric content outweighs their nutritional value. Some good choices in snacks are graham crackers, melba toast, soda crackers, bagels (no egg), English muffins, fruits, and vegetables. These snacks are preferable to snack crackers, [place] fries, TORTILLA CHIPS, and POTATO chips. Popcorn should be air-popped or cooked in small amounts of liquid vegetable oil. Desserts Eat fruit, low-fat yogurt, and fruit ices instead of pastries, cake, and cookies. Sherbet, angel food cake, gelatin dessert, frozen low-fat yogurt, or other frozen products that do not contain saturated fat (pure fruit juice bars, frozen ice pops) are also acceptable.  COOKING METHODS Choose those methods that use little or no fat. They include: Poaching.  Braising.  Steaming.  Grilling.  Baking.  Stir-frying.  Broiling.  Microwaving.  Foods can be cooked in a nonstick pan without added fat, or use a nonfat cooking spray in regular cookware. Limit fried foods and avoid frying in saturated fat. Add moisture to lean meats by using water, broth, cooking wines, and other nonfat or low-fat sauces along with the cooking methods mentioned above. Soups and stews should be chilled after cooking. The fat that forms on top after a few hours in the refrigerator should be skimmed off. When preparing meals, avoid using excess salt. Salt can contribute to raising blood pressure in some people.  EATING AWAY FROM HOME Order entres,  potatoes, and vegetables without sauces or butter. When meat exceeds the size of a deck of cards (3 to 4 ounces), the rest can be taken home for another meal. Choose vegetable or fruit salads and ask for low-calorie salad dressings to be served on the side. Use dressings sparingly. Limit high-fat toppings, such as bacon, crumbled eggs, cheese, sunflower seeds, and olives. Ask for heart-healthy tub margarine instead of butter.  High-Fiber Diet A high-fiber diet changes your normal diet to include more whole grains, legumes, fruits, and vegetables. Changes in the diet involve replacing refined carbohydrates with unrefined foods. The calorie level of the diet is essentially unchanged. The Dietary Reference Intake (recommended amount) for adult males is 38 grams per day. For adult females, it is 25 grams per day. Pregnant and lactating women should consume 28 grams of fiber per day. Fiber is the intact part of a plant that is not broken down during digestion. Functional fiber is fiber that has been isolated from the plant to provide a beneficial effect in the body. PURPOSE  Increase stool bulk.   Ease and regulate bowel movements.   Lower cholesterol.  INDICATIONS THAT YOU NEED MORE FIBER  Constipation and hemorrhoids.   Uncomplicated diverticulosis (intestine condition) and irritable bowel  syndrome.   Weight management.   As a protective measure against hardening of the arteries (atherosclerosis), diabetes, and cancer.   GUIDELINES FOR INCREASING FIBER IN THE DIET  Start adding fiber to the diet slowly. A gradual increase of about 5 more grams (2 slices of whole-wheat bread, 2 servings of most fruits or vegetables, or 1 bowl of high-fiber cereal) per day is best. Too rapid an increase in fiber may result in constipation, flatulence, and bloating.   Drink enough water and fluids to keep your urine clear or pale yellow. Water, juice, or caffeine-free drinks are recommended. Not drinking enough  fluid may cause constipation.   Eat a variety of high-fiber foods rather than one type of fiber.   Try to increase your intake of fiber through using high-fiber foods rather than fiber pills or supplements that contain small amounts of fiber.   The goal is to change the types of food eaten. Do not supplement your present diet with high-fiber foods, but replace foods in your present diet.  INCLUDE A VARIETY OF FIBER SOURCES  Replace refined and processed grains with whole grains, canned fruits with fresh fruits, and incorporate other fiber sources. White rice, white breads, and most bakery goods contain little or no fiber.   Brown whole-grain rice, buckwheat oats, and many fruits and vegetables are all good sources of fiber. These include: broccoli, Brussels sprouts, cabbage, cauliflower, beets, sweet potatoes, white potatoes (skin on), carrots, tomatoes, eggplant, squash, berries, fresh fruits, and dried fruits.   Cereals appear to be the richest source of fiber. Cereal fiber is found in whole grains and bran. Bran is the fiber-rich outer coat of cereal grain, which is largely removed in refining. In whole-grain cereals, the bran remains. In breakfast cereals, the largest amount of fiber is found in those with "bran" in their names. The fiber content is sometimes indicated on the label.   You may need to include additional fruits and vegetables each day.   In baking, for 1 cup white flour, you may use the following substitutions:   1 cup whole-wheat flour minus 2 tablespoons.   1/2 cup white flour plus 1/2 cup whole-wheat flour.

## 2012-10-10 NOTE — Progress Notes (Signed)
  Subjective:    Patient ID: Frank Lucero, male    DOB: December 10, 1980, 32 y.o.   MRN: [id number]  PCP: Leandrew Koyanagi  HPI WAS UP ON THE ROOF AND HAS HAD A HA AND NAUSEOUS. GOT A JOB TUES AND QUIT WED. DIDN'T FEEL LIKE HE WAS OVERHEATED. BEEN ON IMIRPRAMINE FOR 2 YEARS. NO RECTAL ITCHING, PAIN, PRESSURE, OR SOILING. PT DENIES FEVER, CHILLS, BRBPR, vomiting, melena, diarrhea, constipation, abd pain, problems swallowing, heartburn or indigestion.   Past Medical History  Diagnosis Date  . GERD (gastroesophageal reflux disease) 06/25/09    EGD Dr Darrick Penna small HH, mild gastritis, duodenitis  . Obesity   . Helicobacter pylori gastritis 06/2009    (SEROLOGY POS [facility]/ s/p treatment ), Bx NEG FEB 2011  . NASH (nonalcoholic steatohepatitis) JAN 2011 305 LBS, BMI 37.14 Aug 2009 ABD U/S-FATTY LIVER  . 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 Benign tumor      Right leg as a child  . Upper gastrointestinal endoscopy  FEB 2011 [place]    HH, CHRONIC GASTRITIS, DUODENITIS  . Givens capsule study  12/05/2010    SLF: NL TI AND COLON/Small internal hemorrhoids  . Colonoscopy  10/2010    WGN:FAOZH internal hemorrhoids/Ulcers in the terminal ileum, etiology unclear/Normal colon   No Known Allergies  Current Outpatient Prescriptions  Medication Sig Dispense Refill  .      .      . imipramine (TOFRANIL) 10 MG tablet 3 PO QHS        Review of Systems     Objective:   Physical Exam  Vitals reviewed. Constitutional: He is oriented to person, place, and time. He appears well-nourished. No distress.  HENT:  Head: Normocephalic and atraumatic.  Mouth/Throat: Oropharynx is clear and moist. No oropharyngeal exudate.  Eyes: Pupils are equal, round, and reactive to light. No scleral icterus.  Neck: Normal range of motion.  Cardiovascular: Normal rate and regular rhythm.   Pulmonary/Chest: Effort normal and breath sounds  normal. No respiratory distress.  Abdominal: Soft. Bowel sounds are normal. He exhibits no distension. There is no tenderness.  Musculoskeletal: He exhibits no edema.  Neurological: He is alert and oriented to person, place, and time.  NO FOCAL DEFICITS   Psychiatric: He has a normal mood and affect.          Assessment & Plan:

## 2013-02-14 NOTE — Telephone Encounter (Signed)
Open in error

## 2013-02-26 ENCOUNTER — Encounter (HOSPITAL_COMMUNITY): Payer: Self-pay | Admitting: Emergency Medicine

## 2013-02-26 ENCOUNTER — Emergency Department (HOSPITAL_COMMUNITY)
Admission: EM | Admit: 2013-02-26 | Discharge: 2013-02-26 | Disposition: A | Discharge status: Home / Self Care | Payer: Medicaid Other | Attending: Emergency Medicine | Admitting: Emergency Medicine

## 2013-02-26 DIAGNOSIS — Z8639 Personal history of other endocrine, nutritional and metabolic disease: Secondary | ICD-10-CM | POA: Insufficient documentation

## 2013-02-26 DIAGNOSIS — Z8719 Personal history of other diseases of the digestive system: Secondary | ICD-10-CM | POA: Insufficient documentation

## 2013-02-26 DIAGNOSIS — E669 Obesity, unspecified: Secondary | ICD-10-CM | POA: Insufficient documentation

## 2013-02-26 DIAGNOSIS — M2550 Pain in unspecified joint: Secondary | ICD-10-CM

## 2013-02-26 DIAGNOSIS — M25569 Pain in unspecified knee: Secondary | ICD-10-CM | POA: Insufficient documentation

## 2013-02-26 DIAGNOSIS — R110 Nausea: Secondary | ICD-10-CM | POA: Insufficient documentation

## 2013-02-26 DIAGNOSIS — IMO0002 Reserved for concepts with insufficient information to code with codable children: Secondary | ICD-10-CM | POA: Insufficient documentation

## 2013-02-26 DIAGNOSIS — Z87891 Personal history of nicotine dependence: Secondary | ICD-10-CM | POA: Insufficient documentation

## 2013-02-26 DIAGNOSIS — M25529 Pain in unspecified elbow: Secondary | ICD-10-CM | POA: Insufficient documentation

## 2013-02-26 DIAGNOSIS — Z8619 Personal history of other infectious and parasitic diseases: Secondary | ICD-10-CM | POA: Insufficient documentation

## 2013-02-26 DIAGNOSIS — Z862 Personal history of diseases of the blood and blood-forming organs and certain disorders involving the immune mechanism: Secondary | ICD-10-CM | POA: Insufficient documentation

## 2013-02-26 LAB — CBC
MCH: 29.4 pg (ref 26.0–34.0)
MCHC: 34.3 g/dL (ref 30.0–36.0)
Platelets: 238 10*3/uL (ref 150–400)
RBC: 5.1 MIL/uL (ref 4.22–5.81)
RDW: 12.6 % (ref 11.5–15.5)

## 2013-02-26 LAB — COMPREHENSIVE METABOLIC PANEL
ALT: 21 U/L (ref 0–53)
AST: 20 U/L (ref 0–37)
Albumin: 4.3 g/dL (ref 3.5–5.2)
CO2: 27 mEq/L (ref 19–32)
Calcium: 9.4 mg/dL (ref 8.4–10.5)
Sodium: 139 mEq/L (ref 135–145)
Total Protein: 7.8 g/dL (ref 6.0–8.3)

## 2013-02-26 MED ORDER — KETOROLAC TROMETHAMINE 30 MG/ML IJ SOLN
30.0000 mg | Freq: Once | INTRAMUSCULAR | Status: AC
  Administered 2013-02-26: 30 mg via INTRAMUSCULAR
  Filled 2013-02-26: qty 1

## 2013-02-26 MED ORDER — HYDROCODONE-ACETAMINOPHEN 5-325 MG PO TABS: 1.0000 | ORAL_TABLET | Freq: Three times a day (TID) | ORAL | Status: DC | PRN

## 2013-02-26 NOTE — ED Notes (Signed)
Pt alert & oriented x4, stable gait. Patient given discharge instructions, paperwork & prescription(s). Patient  instructed to stop at the registration desk to finish any additional paperwork. Patient verbalized understanding. Pt left department w/ no further questions. 

## 2013-02-26 NOTE — ED Notes (Signed)
Patient states he woke up around 4am with bilateral elbow and knee pain.  States he can't explain the feeling, but feels like he needs to keep moving.  Patient states he became nauseated on the ride to the ER.

## 2013-02-26 NOTE — ED Provider Notes (Signed)
CSN: [id number]     Arrival date & time 02/26/13  0554 History   First MD Initiated Contact with Patient 02/26/13 [phone]     Chief Complaint  Patient presents with  . Joint Pain  . Nausea   (Consider location/radiation/quality/duration/timing/severity/associated sxs/prior Treatment) HPI Patient presents with pain in both elbows, both knees. The sensation is tight.  Onset was sudden, approximately 2 hours prior to my evaluation. Since onset symptoms of been persistent.  There is moderate improvement with movement, no clear exacerbating factors. There is no ongoing other pain, fever, chills, nausea, vomiting. Patient was in his usual state of health prior to the onset of symptoms.  Past Medical History  Diagnosis Date  . GERD (gastroesophageal reflux disease) 06/25/09    EGD Dr Darrick Penna small HH, mild gastritis, duodenitis  . Obesity   . Helicobacter pylori gastritis 06/2009    (SEROLOGY POS [facility]/ s/p treatment ), Bx NEG FEB 2011  . NASH (nonalcoholic steatohepatitis) JAN 2011 305 LBS, BMI 37.14 Aug 2009 ABD U/S-FATTY LIVER  . 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 Benign tumor      Right leg as a child  . Upper gastrointestinal endoscopy  FEB 2011 [place]    HH, CHRONIC GASTRITIS, DUODENITIS  . Givens capsule study  12/05/2010    SLF: NL TI AND COLON/Small internal hemorrhoids  . Colonoscopy  10/2010    JXB:JYNWG internal hemorrhoids/Ulcers in the terminal ileum, etiology unclear/Normal colon   Family History  Problem Relation Age of Onset  . Colon cancer Maternal Grandfather 37   History  Substance Use Topics  . Smoking status: Former Smoker -- 1.00 packs/day for 18 years    Types: Cigarettes    Quit date: 11/10/2009  . Smokeless tobacco: Not on file  . Alcohol Use: No    Review of Systems  Constitutional:       Per HPI, otherwise negative  HENT:       Per HPI, otherwise negative   Respiratory:       Per HPI, otherwise negative  Cardiovascular:       Per HPI, otherwise negative  Gastrointestinal: Negative for vomiting.  Endocrine:       Negative aside from HPI  Genitourinary:       Neg aside from HPI   Musculoskeletal:       Per HPI, otherwise negative  Skin: Negative.   Neurological: Negative for syncope.    Allergies  Review of patient's allergies indicates no known allergies.  Home Medications   Current Outpatient Rx  Name  Route  Sig  Dispense  Refill  . imipramine (TOFRANIL) 10 MG tablet      3 PO QHS   90 tablet   11   . Diethylpropion HCl 25 MG TABS      1 po 1 hour prior to meals TID   93 each   0   . hydrocortisone (ANUSOL-HC) 25 MG suppository   Rectal   Place 1 suppository (25 mg total) rectally every 12 (twelve) hours. For 12 days   24 suppository   0    BP 147/88  Pulse 89  Temp(Src) 97.6 F (36.4 C) (Oral)  Resp 20  Ht 6\' 4"  (1.93 m)  Wt 295 lb (133.811 kg)  BMI 35.92 kg/m2  SpO2 96% Physical Exam  Nursing note and vitals reviewed. Constitutional: He is  oriented to person, place, and time. He appears well-developed. No distress.  HENT:  Head: Normocephalic and atraumatic.  Eyes: Conjunctivae and EOM are normal.  Cardiovascular: Normal rate and regular rhythm.   Pulmonary/Chest: Effort normal. No stridor. No respiratory distress.  Abdominal: He exhibits no distension.  Musculoskeletal: He exhibits no edema and no tenderness.  All 4 affected joints, in all joints in general, no warmth, no restricted range of motion, strength no strength deficits  Neurological: He is alert and oriented to person, place, and time.  Skin: Skin is warm and dry.  Psychiatric: He has a normal mood and affect.    ED Course  Procedures (including critical care time) Labs Review Labs Reviewed  CBC  COMPREHENSIVE METABOLIC PANEL   Imaging Review No results found.  MDM  No diagnosis found. Patient presents with essentially sudden  onset of polyarthralgias.  The distribution of the joints is atypical for a neurologic condition, and the absence of warmth, redness, pain is not consistent with infection. Patient is distally neurologically intact, afebrile, hemodynamically stable, and generally healthy. Patient's labs are non-revealing. Given the absence of distress, but preserved neurologic function, though the patient remains somewhat symptomatic, he was provided for further evaluation and management with primary care and/or orthopedics.  Patient was started on a course of anti-inflammatories, analgesics.    Gerhard Munch, MD 02/26/13 [phone]

## 2015-10-28 ENCOUNTER — Encounter: Payer: Self-pay | Admitting: Gastroenterology

## 2016-06-15 ENCOUNTER — Encounter: Payer: Self-pay | Admitting: Gastroenterology

## 2016-07-12 ENCOUNTER — Ambulatory Visit (INDEPENDENT_AMBULATORY_CARE_PROVIDER_SITE_OTHER): Payer: Managed Care, Other (non HMO) | Admitting: Nurse Practitioner

## 2016-07-12 ENCOUNTER — Encounter: Payer: Self-pay | Admitting: Nurse Practitioner

## 2016-07-12 ENCOUNTER — Other Ambulatory Visit: Payer: Self-pay

## 2016-07-12 VITALS — BP 146/86 | HR 92 | Temp 97.8°F | Ht 76.0 in | Wt 324.6 lb

## 2016-07-12 DIAGNOSIS — K589 Irritable bowel syndrome without diarrhea: Secondary | ICD-10-CM | POA: Diagnosis not present

## 2016-07-12 DIAGNOSIS — K649 Unspecified hemorrhoids: Secondary | ICD-10-CM

## 2016-07-12 DIAGNOSIS — K5000 Crohn's disease of small intestine without complications: Secondary | ICD-10-CM

## 2016-07-12 DIAGNOSIS — K625 Hemorrhage of anus and rectum: Secondary | ICD-10-CM

## 2016-07-12 DIAGNOSIS — K50019 Crohn's disease of small intestine with unspecified complications: Secondary | ICD-10-CM

## 2016-07-12 DIAGNOSIS — R159 Full incontinence of feces: Secondary | ICD-10-CM

## 2016-07-12 MED ORDER — NA SULFATE-K SULFATE-MG SULF 17.5-3.13-1.6 GM/177ML PO SOLN: 1.0000 | ORAL | 0 refills | Status: DC

## 2016-07-12 NOTE — Progress Notes (Signed)
Primary Care Physician:  Curlene Labrum, MD Primary Gastroenterologist:  Dr. Oneida Alar  Chief Complaint  Patient presents with  . Colonoscopy    HPI:   Frank Lucero is a 36 y.o. male who presents on recall for 5 year repeat colonoscopy. Last colonoscopy completed 11/18/2010 for change in stools to diarrhea diagnosed with irritable bowel syndrome which is persistent despite treatment as well as rectal bleeding. His colonoscopy was completed on propofol/MAC and found terminal ileal ulcers due to unclear etiology status post biopsy, normal colon, small internal hemorrhoids. Recommended low residue diet, avoid all NSAIDs, return for follow-up in 3 months. Surgical pathology as per below. Recommended 5 year repeat exam (2017). Givens capsule endoscopy was also completed which found limited views due to retained contents, no blood in the stomach, multiple ulcers in the terminal ileum and extending to the ileocecal valve, occasional small amount of red blood, no masses or AVMs.  Patient was initially diagnosed with Crohn's ileitis and recommended prednisone taper, Pentasa, [facility] IBD panel. Declined referral to [place]. He was eventually evaluated at [facility] due to doubtful Crohn's and terminal ileal ulcers of uncertain significance and was started on imipramine. Overall his symptoms seem to have resolved on this and he was last seen by our office in 2014.  Today he states he's doing well. Diarrhea remains well controlled. Denies abdominal pain, N/V. Has occasional toilet tissue hematochezia. History of hemorrhoids. Denies constipation. Has rectal itching as well and subsequent rectal leaking after wiping. He is interested in intra colonoscopy hemorrhoid banding if possible. Denies melena, fever, chills, unintentional weight loss. Denies chest pain, dyspnea, dizziness, lightheadedness, syncope, near syncope. Denies any other upper or lower GI  symptoms.      Past Medical History:  Diagnosis Date  . GERD (gastroesophageal reflux disease) 06/25/09   EGD Dr Oneida Alar small HH, mild gastritis, duodenitis  . 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 Helicobacter pylori gastritis 06/2009   (SEROLOGY POS [facility]/ s/p treatment ), Bx NEG FEB 2011  . NASH (nonalcoholic steatohepatitis) JAN 2011 305 LBS, BMI 37.14 Aug 2009 ABD U/S-FATTY LIVER  . Obesity     Past Surgical History:  Procedure Laterality Date  . 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 COLONOSCOPY  10/2010   NGE:[id number] internal hemorrhoids/Ulcers in the terminal ileum, etiology unclear/Normal colon  . GIVENS CAPSULE STUDY  12/05/2010   SLF: NL TI AND COLON/Small internal hemorrhoids  . UPPER GASTROINTESTINAL ENDOSCOPY  FEB 2011 [place]   HH, CHRONIC GASTRITIS, DUODENITIS    Current Outpatient Prescriptions  Medication Sig Dispense Refill  . imipramine (TOFRANIL) 10 MG tablet 3 PO QHS 90 tablet 11  . omeprazole (PRILOSEC) 40 MG capsule Take 20 mg by mouth daily.     No current facility-administered medications for this visit.     Allergies as of 07/12/2016  . (No Known Allergies)    Family History  Problem Relation Age of Onset  . Colon cancer Maternal Grandfather 42  . Colon cancer Paternal Grandfather     Social History   Social History  . Marital status: Single    Spouse name: N/A  . Number of children: 2  . Years of education: N/A   Occupational History  . 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 Smoking status: Former Smoker  Packs/day: 1.00    Years: 18.00    Types: Cigarettes    Quit date: 11/10/2009  . Smokeless tobacco: Never Used  . Alcohol use No  . Drug use: No  . Sexual activity: Not on file   Other Topics Concern  . Not on file   Social History Narrative  . No narrative on file    Review of Systems: Complete ROS negative except as per HPI.    Physical  Exam: BP (!) 146/86   Pulse 92   Temp 97.8 F (36.6 C) (Oral)   Ht 6' 4"  (1.93 m)   Wt (!) 324 lb 9.6 oz (147.2 kg)   BMI 39.51 kg/m  General:   Obese male. Alert and oriented. Pleasant and cooperative. Well-nourished and well-developed.  Head:  Normocephalic and atraumatic. Eyes:  Without icterus, sclera clear and conjunctiva pink.  Ears:  Normal auditory acuity. Cardiovascular:  S1, S2 present without murmurs appreciated. Extremities without clubbing or edema. Respiratory:  Clear to auscultation bilaterally. No wheezes, rales, or rhonchi. No distress.  Gastrointestinal:  +BS, obese but soft, non-tender and non-distended. No HSM noted. No guarding or rebound. No masses appreciated.  Rectal:  Deferred  Musculoskalatal:  Symmetrical without gross deformities. Skin:  Intact without significant lesions or rashes. Neurologic:  Alert and oriented x4;  grossly normal neurologically. Psych:  Alert and cooperative. Normal mood and affect. Heme/Lymph/Immune: No excessive bruising noted.    07/12/2016 3:39 PM   Disclaimer: This note was dictated with voice recognition software. Similar sounding words can inadvertently be transcribed and may not be corrected upon review.

## 2016-07-12 NOTE — Patient Instructions (Signed)
1. We will schedule your procedure for you. 2. I will ask our medical records person to forward the colonoscopy report and recommendations to your primary care. 3. Return for follow-up based on postprocedure recommendations. 4. Call us if you have any worsening or severe symptoms.

## 2016-07-12 NOTE — Assessment & Plan Note (Signed)
IBS symptoms generally well controlled, no ongoing diarrhea. No abdominal pain. Continue to monitor, return for follow-up based on postprocedure recommendations after due surveillance colonoscopy.

## 2016-07-12 NOTE — Assessment & Plan Note (Signed)
Occasional rectal bleeding in the setting of hemorrhoids. Hemorrhoid management as per above. He is due for surveillance colonoscopy at this time regardless. This will allow further evaluation. Return for follow-up based on postprocedure recommendations.

## 2016-07-12 NOTE — Assessment & Plan Note (Signed)
Noted internal hemorrhoids on last colonoscopy. Occasional toilet tissue hematochezia. Rectal leaking likely due to hemorrhoids. The patient would like hemorrhoid banding during his colonoscopy if it is possible. We will add this is a possibility onto his procedure. Return for follow-up as recommended on postprocedure recommendations.

## 2016-07-12 NOTE — Assessment & Plan Note (Signed)
Noted fecal incontinence initially thought to be possibly due to hemorrhoids. He is due for regular colonoscopy at this time for surveillance which will allow further assessment. Would like hemorrhoid banding as per above, if possible. Return for follow-up based on postprocedure recommendations.

## 2016-07-12 NOTE — Assessment & Plan Note (Signed)
History of terminal ileitis initially thought to be Crohn's but Prometheus panel unremarkable. [facility] and started on imipramine which is controlled his symptoms well. He is due for surveillance colonoscopy based on previous recommendations. We will proceed at this time along with possible hemorrhoid banding as per above. Return for follow-up based on postprocedure recommendations. He is generally asymptomatic from a GI standpoint.  Proceed with colonoscopy +/- hemorrhoid banding with Dr. Darrick PennaFields in the near future. The risks, benefits, and alternatives have been discussed in detail with the patient. They state understanding and desire to proceed.   The patient is not on any anticoagulants, anxiolytics, chronic pain medications, or antidepressants. Conscious sedation should be adequate for his procedure.

## 2016-07-13 NOTE — Progress Notes (Signed)
cc'ed to pcp °

## 2016-07-28 ENCOUNTER — Encounter (HOSPITAL_COMMUNITY)
Admission: RE | Disposition: A | Discharge status: Home / Self Care | Payer: Self-pay | Source: Ambulatory Visit | Attending: Gastroenterology

## 2016-07-28 ENCOUNTER — Other Ambulatory Visit: Payer: Self-pay

## 2016-07-28 ENCOUNTER — Encounter (HOSPITAL_COMMUNITY): Payer: Self-pay | Admitting: *Deleted

## 2016-07-28 ENCOUNTER — Ambulatory Visit (HOSPITAL_COMMUNITY)
Admission: RE | Admit: 2016-07-28 | Discharge: 2016-07-28 | Disposition: A | Discharge status: Home / Self Care | Payer: Managed Care, Other (non HMO) | Source: Ambulatory Visit | Attending: Gastroenterology | Admitting: Gastroenterology

## 2016-07-28 ENCOUNTER — Telehealth: Payer: Self-pay | Admitting: Gastroenterology

## 2016-07-28 DIAGNOSIS — Z87891 Personal history of nicotine dependence: Secondary | ICD-10-CM | POA: Insufficient documentation

## 2016-07-28 DIAGNOSIS — K921 Melena: Secondary | ICD-10-CM | POA: Diagnosis not present

## 2016-07-28 DIAGNOSIS — K649 Unspecified hemorrhoids: Secondary | ICD-10-CM

## 2016-07-28 DIAGNOSIS — K50019 Crohn's disease of small intestine with unspecified complications: Secondary | ICD-10-CM

## 2016-07-28 DIAGNOSIS — K644 Residual hemorrhoidal skin tags: Secondary | ICD-10-CM | POA: Insufficient documentation

## 2016-07-28 DIAGNOSIS — Q438 Other specified congenital malformations of intestine: Secondary | ICD-10-CM | POA: Insufficient documentation

## 2016-07-28 DIAGNOSIS — Z79899 Other long term (current) drug therapy: Secondary | ICD-10-CM | POA: Diagnosis not present

## 2016-07-28 DIAGNOSIS — K219 Gastro-esophageal reflux disease without esophagitis: Secondary | ICD-10-CM | POA: Diagnosis not present

## 2016-07-28 DIAGNOSIS — K648 Other hemorrhoids: Secondary | ICD-10-CM | POA: Diagnosis not present

## 2016-07-28 DIAGNOSIS — K5730 Diverticulosis of large intestine without perforation or abscess without bleeding: Secondary | ICD-10-CM | POA: Diagnosis not present

## 2016-07-28 DIAGNOSIS — K625 Hemorrhage of anus and rectum: Secondary | ICD-10-CM | POA: Diagnosis not present

## 2016-07-28 DIAGNOSIS — R159 Full incontinence of feces: Secondary | ICD-10-CM

## 2016-07-28 HISTORY — PX: COLONOSCOPY: SHX5424

## 2016-07-28 SURGERY — COLONOSCOPY
Anesthesia: Moderate Sedation

## 2016-07-28 MED ORDER — MIDAZOLAM HCL 5 MG/5ML IJ SOLN
INTRAMUSCULAR | Status: DC | PRN
  Administered 2016-07-28 (×4): 2 mg via INTRAVENOUS

## 2016-07-28 MED ORDER — PROMETHAZINE HCL 25 MG/ML IJ SOLN
INTRAMUSCULAR | Status: DC | PRN
  Administered 2016-07-28 (×2): 12.5 mg via INTRAVENOUS

## 2016-07-28 MED ORDER — SIMETHICONE 40 MG/0.6ML PO SUSP
ORAL | Status: AC
  Filled 2016-07-28: qty 30

## 2016-07-28 MED ORDER — MEPERIDINE HCL 100 MG/ML IJ SOLN
INTRAMUSCULAR | Status: DC
Start: 2016-07-28 — End: 2016-07-28
  Filled 2016-07-28: qty 2

## 2016-07-28 MED ORDER — MEPERIDINE HCL 100 MG/ML IJ SOLN
INTRAMUSCULAR | Status: DC | PRN
  Administered 2016-07-28 (×3): 25 mg via INTRAVENOUS
  Administered 2016-07-28: 50 mg via INTRAVENOUS

## 2016-07-28 MED ORDER — SODIUM CHLORIDE 0.9 % IV SOLN
INTRAVENOUS | Status: DC
  Administered 2016-07-28: 1000 mL via INTRAVENOUS

## 2016-07-28 MED ORDER — SIMETHICONE 40 MG/0.6ML PO SUSP
ORAL | Status: DC | PRN
  Administered 2016-07-28: 2.5 mL

## 2016-07-28 MED ORDER — SODIUM CHLORIDE 0.9% FLUSH
INTRAVENOUS | Status: AC
  Filled 2016-07-28: qty 10

## 2016-07-28 MED ORDER — PROMETHAZINE HCL 25 MG/ML IJ SOLN
INTRAMUSCULAR | Status: AC
  Filled 2016-07-28: qty 1

## 2016-07-28 MED ORDER — MIDAZOLAM HCL 5 MG/5ML IJ SOLN
INTRAMUSCULAR | Status: AC
  Filled 2016-07-28: qty 10

## 2016-07-28 NOTE — Op Note (Signed)
[facility] Patient Name: Frank Lucero Procedure Date: 07/28/2016 7:44 AM MRN: [id number] Date of Birth: [birth year] Attending MD: Jonette Eva , MD CSN: [id number] Age: 36 Admit Type: Outpatient Procedure:                Colonoscopy, DIAGNOSTIC Indications:              Hematochezia Providers:                Jonette Eva, MD, Nena Polio, RN, Lollie Marrow. Lake,                            Pensions consultant Referring MD:             Juliette Alcide Medicines:                Promethazine 25 mg IV, Meperidine 125 mg IV,                            Midazolam 8 mg IV Complications:            No immediate complications. Estimated Blood Loss:     Estimated blood loss: none. Procedure:                Pre-Anesthesia Assessment:                           - Prior to the procedure, a History and Physical                            was performed, and patient medications and                            allergies were reviewed. The patient's tolerance of                            previous anesthesia was also reviewed. The risks                            and benefits of the procedure and the sedation                            options and risks were discussed with the patient.                            All questions were answered, and informed consent                            was obtained. Prior Anticoagulants: The patient has                            taken ibuprofen. ASA Grade Assessment: II - A                            patient with mild systemic disease. After reviewing  the risks and benefits, the patient was deemed in                            satisfactory condition to undergo the procedure.                            After obtaining informed consent, the colonoscope                            was passed under direct vision. Throughout the                            procedure, the patient's blood pressure, pulse, and                            oxygen saturations  were monitored continuously. The                            EC-[id number] ([id number]) scope was introduced through                            the anus and advanced to the 10 cm into the ileum.                            The colonoscopy was performed without difficulty.                            The patient tolerated the procedure well. The                            quality of the bowel preparation was excellent. The                            terminal ileum, ileocecal valve, appendiceal                            orifice, and rectum were photographed. Scope In: 9:09:00 AM Scope Out: 9:23:35 AM Scope Withdrawal Time: 0 hours 11 minutes 36 seconds  Total Procedure Duration: 0 hours 14 minutes 35 seconds  Findings:      A few small and large-mouthed diverticula were found in the sigmoid       colon and descending colon.      The recto-sigmoid colon was mildly redundant.      Internal hemorrhoids were found during retroflexion. The hemorrhoids       were small.      External hemorrhoids were found during retroflexion. The hemorrhoids       were moderate. Impression:               - Diverticulosis in the sigmoid colon and in the                            descending colon.                           -  Redundant LEFT colon.                           - Internal hemorrhoids.                           - External hemorrhoids. Moderate Sedation:      Moderate (conscious) sedation was administered by the endoscopy nurse       and supervised by the endoscopist. The following parameters were       monitored: oxygen saturation, heart rate, blood pressure, and response       to care. Total physician intraservice time was 33 minutes. Recommendation:           - High fiber diet.                           - Continue present medications.                           - Repeat colonoscopy at age 61 for screening                            purposes.                           - Return to GI office in 6 months.                            - Patient has a contact number available for                            emergencies. The signs and symptoms of potential                            delayed complications were discussed with the                            patient. Return to normal activities tomorrow.                            Written discharge instructions were provided to the                            patient. Procedure Code(s):        --- Professional ---                           [id number], Colonoscopy, flexible; diagnostic, including                            collection of specimen(s) by brushing or washing,                            when performed (separate procedure)                           99152, Moderate sedation services provided by  the                            same physician or other qualified health care                            professional performing the diagnostic or                            therapeutic service that the sedation supports,                            requiring the presence of an independent trained                            observer to assist in the monitoring of the                            patient's level of consciousness and physiological                            status; initial 15 minutes of intraservice time,                            patient age 90 years or older                           [phone], Moderate sedation services; each additional                            15 minutes intraservice time Diagnosis Code(s):        --- Professional ---                           K64.4, Residual hemorrhoidal skin tags                           K64.8, Other hemorrhoids                           K92.1, Melena (includes Hematochezia)                           K57.30, Diverticulosis of large intestine without                            perforation or abscess without bleeding                           Q43.8, Other specified congenital malformations of                             intestine CPT copyright 2016 American Medical Association. All rights reserved. The codes documented in this report are preliminary and upon coder review may  be revised to meet current compliance requirements. Jonette EvaSandi Fields, MD Jonette EvaSandi Fields, MD 07/28/2016 9:49:39 AM This report  has been signed electronically. Number of Addenda: 0

## 2016-07-28 NOTE — Progress Notes (Signed)
Per Dr. Darrick PennaFields, patient may return to work July 31, 2016.

## 2016-07-28 NOTE — Discharge Instructions (Signed)
You DID NOT HAVE ANY POLYPS. YOU HAVE DIVERTICULOSIS IN YOUR LEFT COLON. You have SMALL internal AND LARGE EXTERNAL HEMORRHOIDS.   SEE INSTRUCTIONS BELOW TO MANAGE YOUR CONSTIPATION.  DRINK WATER TO KEEP URINE LIGHT YELLOW.  FOLLOW A HIGH FIBER DIET. AVOID ITEMS THAT CAUSE BLOATING & GAS. SEE INFO BELOW.  SEE SURGERY TO FIX YOUR HEMORRHOIDS.  FOLLOW UP IN 6 MOS.  Next colonoscopy AT AGE 36.    RETURN TO WORK MARCH 12,2018.  GENERAL SURGEON REFERRAL WILL BE MADE BY MY OFFICE.  SOMEONE WILL CONTACT YOU REGARDING THIS APPOINTMENT  Colonoscopy Care After Read the instructions outlined below and refer to this sheet in the next week. These discharge instructions provide you with general information on caring for yourself after you leave the hospital. While your treatment has been planned according to the most current medical practices available, unavoidable complications occasionally occur. If you have any problems or questions after discharge, call DR. FIELDS, [phone].  ACTIVITY  You may resume your regular activity, but move at a slower pace for the next 24 hours.   Take frequent rest periods for the next 24 hours.   Walking will help get rid of the air and reduce the bloated feeling in your belly (abdomen).   No driving for 24 hours (because of the medicine (anesthesia) used during the test).   You may shower.   Do not sign any important legal documents or operate any machinery for 24 hours (because of the anesthesia used during the test).    NUTRITION  Drink plenty of fluids.   You may resume your normal diet as instructed by your doctor.   Begin with a light meal and progress to your normal diet. Heavy or fried foods are harder to digest and may make you feel sick to your stomach (nauseated).   Avoid alcoholic beverages for 24 hours or as instructed.    MEDICATIONS  You may resume your normal medications.   WHAT YOU CAN EXPECT TODAY  Some feelings of  bloating in the abdomen.   Passage of more gas than usual.   Spotting of blood in your stool or on the toilet paper  .  IF YOU HAD POLYPS REMOVED DURING THE COLONOSCOPY:  Eat a soft diet IF YOU HAVE NAUSEA, BLOATING, ABDOMINAL PAIN, OR VOMITING.    FINDING OUT THE RESULTS OF YOUR TEST Not all test results are available during your visit. DR. Darrick Penna WILL CALL YOU WITHIN 7 DAYS OF YOUR PROCEDUE WITH YOUR RESULTS. Do not assume everything is normal if you have not heard from DR. FIELDS IN ONE WEEK, CALL HER OFFICE AT [phone].  SEEK IMMEDIATE MEDICAL ATTENTION AND CALL THE OFFICE: [phone] IF:  You have more than a spotting of blood in your stool.   Your belly is swollen (abdominal distention).   You are nauseated or vomiting.   You have a temperature over 101F.   You have abdominal pain or discomfort that is severe or gets worse throughout the day.   Constipation in Adults Constipation is having fewer than 2 bowel movements per week. Usually, the stools are hard. As we grow older, constipation is more common. If you try to fix constipation with laxatives, the problem may get worse. This is because laxatives taken over a long period of time make the colon muscles weaker. A low-fiber diet, not taking in enough fluids, and taking some medicines may make these problems worse.  HOME CARE INSTRUCTIONS  Constipation is usually best cared for  without medicines. Increasing dietary fiber and eating more fruits and vegetables is the best way to manage constipation.   Slowly increase fiber intake to 25 to 38 grams per day. Whole grains, fruits, vegetables, and legumes are good sources of fiber. A dietitian can further help you incorporate high-fiber foods into your diet.   Drink enough water and fluids to keep your urine clear or pale yellow.   A fiber supplement may be added to your diet if you cannot get enough fiber from foods.   Increasing your activities also helps improve  regularity.   Stronger measures, such as magnesium sulfate, should be avoided if possible. This may cause uncontrollable diarrhea. Using magnesium sulfate may not allow you time to make it to the bathroom.    High-Fiber Diet A high-fiber diet changes your normal diet to include more whole grains, legumes, fruits, and vegetables. Changes in the diet involve replacing refined carbohydrates with unrefined foods. The calorie level of the diet is essentially unchanged. The Dietary Reference Intake (recommended amount) for adult males is 38 grams per day. For adult females, it is 25 grams per day. Pregnant and lactating women should consume 28 grams of fiber per day. Fiber is the intact part of a plant that is not broken down during digestion. Functional fiber is fiber that has been isolated from the plant to provide a beneficial effect in the body.  PURPOSE  Increase stool bulk.   Ease and regulate bowel movements.   Lower cholesterol.   REDUCE RISK OF COLON CANCER  INDICATIONS THAT YOU NEED MORE FIBER  Constipation and hemorrhoids.   Uncomplicated diverticulosis (intestine condition) and irritable bowel syndrome.   Weight management.   As a protective measure against hardening of the arteries (atherosclerosis), diabetes, and cancer.   GUIDELINES FOR INCREASING FIBER IN THE DIET  Start adding fiber to the diet slowly. A gradual increase of about 5 more grams (2 slices of whole-wheat bread, 2 servings of most fruits or vegetables, or 1 bowl of high-fiber cereal) per day is best. Too rapid an increase in fiber may result in constipation, flatulence, and bloating.   Drink enough water and fluids to keep your urine clear or pale yellow. Water, juice, or caffeine-free drinks are recommended. Not drinking enough fluid may cause constipation.   Eat a variety of high-fiber foods rather than one type of fiber.   Try to increase your intake of fiber through using high-fiber foods rather than  fiber pills or supplements that contain small amounts of fiber.   The goal is to change the types of food eaten. Do not supplement your present diet with high-fiber foods, but replace foods in your present diet.   INCLUDE A VARIETY OF FIBER SOURCES  Replace refined and processed grains with whole grains, canned fruits with fresh fruits, and incorporate other fiber sources. White rice, white breads, and most bakery goods contain little or no fiber.   Brown whole-grain rice, buckwheat oats, and many fruits and vegetables are all good sources of fiber. These include: broccoli, Brussels sprouts, cabbage, cauliflower, beets, sweet potatoes, white potatoes (skin on), carrots, tomatoes, eggplant, squash, berries, fresh fruits, and dried fruits.   Cereals appear to be the richest source of fiber. Cereal fiber is found in whole grains and bran. Bran is the fiber-rich outer coat of cereal grain, which is largely removed in refining. In whole-grain cereals, the bran remains. In breakfast cereals, the largest amount of fiber is found in those with "bran" in their  names. The fiber content is sometimes indicated on the label.   You may need to include additional fruits and vegetables each day.   In baking, for 1 cup white flour, you may use the following substitutions:   1 cup whole-wheat flour minus 2 tablespoons.   1/2 cup white flour plus 1/2 cup whole-wheat flour.   Diverticulosis Diverticulosis is a common condition that develops when small pouches (diverticula) form in the wall of the colon. The risk of diverticulosis increases with age. It happens more often in people who eat a low-fiber diet. Most individuals with diverticulosis have no symptoms. Those individuals with symptoms usually experience belly (abdominal) pain, constipation, or loose stools (diarrhea).  HOME CARE INSTRUCTIONS  Increase the amount of fiber in your diet as directed by your caregiver or dietician. This may reduce symptoms of  diverticulosis.   Drink at least 6 to 8 glasses of water each day to prevent constipation.   Try not to strain when you have a bowel movement.   THERE IS NO NEED TO Avoid nuts and seeds to prevent complications.   FOODS HAVING HIGH FIBER CONTENT INCLUDE:  Fruits. Apple, peach, pear, tangerine, raisins, prunes.   Vegetables. Brussels sprouts, asparagus, broccoli, cabbage, carrot, cauliflower, romaine lettuce, spinach, summer squash, tomato, winter squash, zucchini.   Starchy Vegetables. Baked beans, kidney beans, lima beans, split peas, lentils, potatoes (with skin).   Grains. Whole wheat bread, brown rice, bran flake cereal, plain oatmeal, white rice, shredded wheat, bran muffins.    Hemorrhoids Hemorrhoids are dilated (enlarged) veins around the rectum. Sometimes clots will form in the veins. This makes them swollen and painful. These are called thrombosed hemorrhoids. Causes of hemorrhoids include:  Constipation.   Straining to have a bowel movement.   HEAVY LIFTING  HOME CARE INSTRUCTIONS  Eat a well balanced diet and drink 6 to 8 glasses of water every day to avoid constipation. You may also use a bulk laxative.   Avoid straining to have bowel movements.   Keep anal area dry and clean.   Do not use a donut shaped pillow or sit on the toilet for long periods. This increases blood pooling and pain.   Move your bowels when your body has the urge; this will require less straining and will decrease pain and pressure.

## 2016-07-28 NOTE — H&P (Signed)
Primary Care Physician:  Curlene Labrum, MD Primary Gastroenterologist:  Dr. Oneida Alar  Pre-Procedure History & Physical: HPI:  Frank Lucero is a 36 y.o. male here for  RECTAL BLEEDING/pain.  Past Medical History:  Diagnosis Date  . GERD (gastroesophageal reflux disease) 06/25/09   EGD Dr Oneida Alar small HH, mild gastritis, duodenitis  . 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 Helicobacter pylori gastritis 06/2009   (SEROLOGY POS [facility]/ s/p treatment ), Bx NEG FEB 2011  . NASH (nonalcoholic steatohepatitis) JAN 2011 305 LBS, BMI 37.14 Aug 2009 ABD U/S-FATTY LIVER  . Obesity     Past Surgical History:  Procedure Laterality Date  . 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 COLONOSCOPY  10/2010   DPT:ELMRA internal hemorrhoids/Ulcers in the terminal ileum, etiology unclear/Normal colon  . GIVENS CAPSULE STUDY  12/05/2010   SLF: NL TI AND COLON/Small internal hemorrhoids  . UPPER GASTROINTESTINAL ENDOSCOPY  FEB 2011 [place], [place]   HH, CHRONIC GASTRITIS, DUODENITIS    Prior to Admission medications   Medication Sig Start Date End Date Taking? Authorizing Provider  ibuprofen (ADVIL,MOTRIN) 200 MG tablet Take 600 mg by mouth every 8 (eight) hours as needed (for headache/pain.).   Yes Historical Provider, MD  imipramine (TOFRANIL) 10 MG tablet 3 PO QHS Patient taking differently: Take 30 mg by mouth at bedtime.  01/24/12  Yes Danie Binder, MD  ketoconazole (NIZORAL) 2 % shampoo Apply 1 application topically 2 (two) times a week. 8 WEEK THERAPY COURSE 06/12/16  Yes Historical Provider, MD  Na Sulfate-K Sulfate-Mg Sulf (SUPREP BOWEL PREP KIT) 17.5-3.13-1.6 GM/180ML SOLN Take 1 kit by mouth as directed. 07/12/16  Yes Danie Binder, MD  omeprazole (PRILOSEC) 20 MG capsule Take 20 mg by mouth at bedtime. 05/31/16  Yes Historical Provider, MD    Allergies as of 07/12/2016  . (No Known Allergies)    Family History  Problem Relation Age of Onset  . Colon cancer Maternal  Grandfather 27  . Colon cancer Paternal Grandfather     Social History   Social History  . Marital status: Single    Spouse name: N/A  . Number of children: 2  . Years of education: N/A   Occupational History  . 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 Smoking status: Former Smoker    Packs/day: 1.00    Years: 18.00    Types: Cigarettes    Quit date: 11/10/2009  . Smokeless tobacco: Never Used  . Alcohol use No  . Drug use: No  . Sexual activity: Not on file   Other Topics Concern  . Not on file   Social History Narrative  . No narrative on file    Review of Systems: See HPI, otherwise negative ROS   Physical Exam: BP (!) 144/72   Pulse 78   Temp 97.5 F (36.4 C) (Oral)   Resp 19   Ht _0  (1.93 m)   Wt (!) 320 lb (145.2 kg)   SpO2 97%   BMI 38.95 kg/m  General:   Alert,  pleasant and cooperative in NAD Head:  Normocephalic and atraumatic. Neck:  Supple; Lungs:  Clear throughout to auscultation.    Heart:  Regular rate and rhythm. Abdomen:  Soft, nontender and nondistended. Normal bowel sounds, without guarding, and without rebound.   Neurologic:  Alert and  oriented x4;  grossly normal neurologically.  Impression/Plan:     RECTAL BLEEDING  PLAN:  1. TCS/? Hemorrhoid banding TODAY. DISCUSSED PROCEDURE, BENEFITS, & RISKS: < 1% chance of medication reaction, bleeding, perforation, PELVIC VEIN SEPSIS,or rupture of spleen/liver.

## 2016-07-28 NOTE — Telephone Encounter (Signed)
PT NEEDS TO SEE SURGERY TO FIX HIS HEMORRHOIDS. HE NEEDS AN APPT ASAP. HIS INSURANCE CHANGES APR 1.

## 2016-07-28 NOTE — Progress Notes (Signed)
Please excuse Frank Lucero from work on Friday March 9th, 2018. He may not drive, operate heavy machinery, or sign legal documents for 24 hours.

## 2016-07-28 NOTE — Telephone Encounter (Signed)
Referral has been made.

## 2016-08-01 ENCOUNTER — Encounter (HOSPITAL_COMMUNITY): Payer: Self-pay | Admitting: Gastroenterology

## 2016-08-01 NOTE — Telephone Encounter (Signed)
The patient called in asking about the referral to see the surgeon.  I called and spoke with Tresa EndoKelly and she stated that they are working on the referrals.   Patient can be reached at [phone].

## 2016-08-01 NOTE — Telephone Encounter (Signed)
Per Eunice Blaseebbie they will not be able to see the patient until 08/17/16.  Please see if we can send the patient to [facility] ASAP.

## 2016-08-02 NOTE — Telephone Encounter (Signed)
Referral has been sent to [facility]

## 2016-08-03 NOTE — Telephone Encounter (Signed)
Pt has an appointment with [facility] on 08/10/16 @ 8:50 am.

## 2016-08-03 NOTE — Telephone Encounter (Signed)
Pt is aware of appointment 

## 2016-09-13 ENCOUNTER — Other Ambulatory Visit: Payer: Self-pay | Admitting: Gastroenterology

## 2016-10-12 NOTE — Progress Notes (Signed)
REVIEWED-NO ADDITIONAL RECOMMENDATIONS. 

## 2017-01-31 ENCOUNTER — Ambulatory Visit (INDEPENDENT_AMBULATORY_CARE_PROVIDER_SITE_OTHER): Payer: Managed Care, Other (non HMO) | Admitting: Nurse Practitioner

## 2017-01-31 ENCOUNTER — Other Ambulatory Visit: Payer: Self-pay

## 2017-01-31 ENCOUNTER — Encounter: Payer: Self-pay | Admitting: Nurse Practitioner

## 2017-01-31 VITALS — BP 137/85 | HR 86 | Temp 97.6°F | Ht 76.0 in | Wt 312.0 lb

## 2017-01-31 DIAGNOSIS — K649 Unspecified hemorrhoids: Secondary | ICD-10-CM

## 2017-01-31 DIAGNOSIS — K625 Hemorrhage of anus and rectum: Secondary | ICD-10-CM | POA: Diagnosis not present

## 2017-01-31 DIAGNOSIS — R159 Full incontinence of feces: Secondary | ICD-10-CM

## 2017-01-31 MED ORDER — HYDROCORTISONE 2.5 % RE CREA: 1.0000 "application " | TOPICAL_CREAM | Freq: Two times a day (BID) | RECTAL | 2 refills | Status: DC

## 2017-01-31 NOTE — Progress Notes (Signed)
CC'ED TO PCP 

## 2017-01-31 NOTE — Patient Instructions (Signed)
1. I have sent an Anusol rectal cream to your pharmacy. You can use is twice a day for up to 10 days at a time. 2. I will put in an order for the manometry testing. We can discuss if you're able to have this done depending on Price. 3. Return for follow-up in 3 months. 4. Call if you have any questions or concerns.

## 2017-01-31 NOTE — Assessment & Plan Note (Signed)
The patient has a known history of hemorrhoids. Internal and external hemorrhoids were found on his colonoscopy. He was referred to surgeon for hemorrhoid surgery. He was told by that surgeon that there isn't really any problem and there was only a single hemorrhoid which is banded in office. He continues to have symptoms after this procedure including worsening toilet tissue hematochezia, feeling of the hemorrhoid protruding, rectal leakage. At this point his insurance to Dr. Weston SettleWise reset and he is not sure he can afford to go back to a surgeon and have further surgery. I will send an Anusol rectal cream to see if we can help with his symptoms for the time being. Return for follow-up in 3 months.

## 2017-01-31 NOTE — Assessment & Plan Note (Signed)
Colonoscopy up-to-date and reassuring. Rectal bleeding likely due to internal and external hemorrhoids which he was referred to a surgeon for hemorrhoid surgery as per above. Still with issues including toilet tissue hematochezia which she feels is worsening. Return for follow-up in 3 months. Further hemorrhoid management as per above.

## 2017-01-31 NOTE — Progress Notes (Signed)
Referring Provider: Juliette AlcideBurdine, Steven E, MD Primary Care Physician:  Juliette AlcideBurdine, Steven E, MD Primary GI:  Dr. Darrick PennaFields  Chief Complaint  Patient presents with  . Encopresis    pp f/u, still having "leakage"    HPI:   Frank Lucero is a 36 y.o. male who presents For follow-up on rectal bleeding, hemorrhoids, incontinence. Patient was last seen in our office 07/12/2016 for IBS, terminal ileitis, rectal bleeding, hemorrhoids, incontinence. At that time he was doing well, diarrhea was controlled and no pain noted. Occasional toilet tissue hematochezia and the setting of a history of hemorrhoids. Has rectal itching as well as rectal leaking after wiping. He is interested in antral colonoscopy hemorrhoid banding if possible. No other GI symptoms. He was referred for surveillance colonoscopy related to terminal ileitis.  Colonoscopy report reviewed. Colonoscopy completed 07/28/2016 which found diverticulosis in the sigmoid colon and descending colon, redundant left colon, internal hemorrhoids, external hemorrhoids. Recommended high-fiber diet, continue medications, repeat colonoscopy at age 36. The patient was informed she needed an appointment to see a surgeon for his hemorrhoids. Appointment was made 08/10/2016.  Today he states he saw the surgeon in [place] and was told everything looked alright. Had banding of one hemorrhoid. Still having rectal leakage. Still intermittent toilet tissue hematochezia. Denies abdominal pain, N/V, overt toilet bowl/stool hematochezia, melena, fever, chills, unintentional weight loss. Colonoscopy report reviewed with the patient. Has rectal leakage with every bowel movement and sometimes when he hasn't had a bowel movement. Feels it's getting worse. He can at times feel his hemorrhoid(s) protruding, even after the banding of a single hemorrhoid ("only problem" per surgery.) Denies chest pain, dyspnea, dizziness, lightheadedness, syncope, near syncope. Denies any other  upper or lower GI symptoms.  Past Medical History:  Diagnosis Date  . GERD (gastroesophageal reflux disease) 06/25/09   EGD Dr Darrick PennaFields small HH, mild gastritis, duodenitis  . 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 Helicobacter pylori gastritis 06/2009   (SEROLOGY POS [facility]/ s/p treatment ), Bx NEG FEB 2011  . Hemorrhoids   . NASH (nonalcoholic steatohepatitis) JAN 2011 305 LBS, BMI 37.14 Aug 2009 ABD U/S-FATTY LIVER  . Obesity     Past Surgical History:  Procedure Laterality Date  . 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 COLONOSCOPY  10/2010   [id number]:Small internal hemorrhoids/Ulcers in the terminal ileum, etiology unclear/Normal colon  . COLONOSCOPY N/A 07/28/2016   Procedure: COLONOSCOPY;  Surgeon: West BaliSandi L Fields, MD;  Location: [facility];  Service: Endoscopy;  Laterality: N/A;  8:30am  . GIVENS CAPSULE STUDY  12/05/2010   SLF: NL TI AND COLON/Small internal hemorrhoids  . UPPER GASTROINTESTINAL ENDOSCOPY  FEB 2011 [place]   HH, CHRONIC GASTRITIS, DUODENITIS    Current Outpatient Prescriptions  Medication Sig Dispense Refill  . ibuprofen (ADVIL,MOTRIN) 200 MG tablet Take 600 mg by mouth every 8 (eight) hours as needed (for headache/pain.).    Marland Kitchen. imipramine (TOFRANIL) 10 MG tablet 3 PO QHS (Patient taking differently: Take 30 mg by mouth at bedtime. ) 90 tablet 11  . ketoconazole (NIZORAL) 2 % shampoo Apply 1 application topically 2 (two) times a week. 8 WEEK THERAPY COURSE    . omeprazole (PRILOSEC) 20 MG capsule Take 20 mg by mouth at bedtime.     No current facility-administered medications for this visit.     Allergies as of 01/31/2017  . (No Known Allergies)  Family History  Problem Relation Age of Onset  . Colon cancer Maternal Grandfather 25  . Colon cancer Paternal Grandfather     Social History   Social History  . Marital status: Single    Spouse name: N/A  . Number of children: 2  . Years of education: N/A   Occupational  History  . 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 Smoking status: Former Smoker    Packs/day: 1.00    Years: 18.00    Types: Cigarettes    Quit date: 11/10/2009  . Smokeless tobacco: Never Used  . Alcohol use No  . Drug use: No  . Sexual activity: Not Asked   Other Topics Concern  . None   Social History Narrative  . None    Review of Systems: Complete ROS negative except as per HPI.   Physical Exam: BP 137/85   Pulse 86   Temp 97.6 F (36.4 C) (Oral)   Ht  (1.93 m)   Wt (!) 312 lb (141.5 kg)   BMI 37.98 kg/m  General:   Obese male. Alert and oriented. Pleasant and cooperative. Well-nourished and well-developed.  Eyes:  Without icterus, sclera clear and conjunctiva pink.  Ears:  Normal auditory acuity. Cardiovascular:  S1, S2 present without murmurs appreciated. Extremities without clubbing or edema. Respiratory:  Clear to auscultation bilaterally. No wheezes, rales, or rhonchi. No distress.  Gastrointestinal:  +BS, rounded but soft, non-tender and non-distended. No HSM noted. No guarding or rebound. No masses appreciated.  Rectal:  Deferred  Musculoskalatal:  Symmetrical without gross deformities. Neurologic:  Alert and oriented x4;  grossly normal neurologically. Psych:  Alert and cooperative. Normal mood and affect. Heme/Lymph/Immune: No excessive bruising noted.    01/31/2017 9:58 AM   Disclaimer: This note was dictated with voice recognition software. Similar sounding words can inadvertently be transcribed and may not be corrected upon review.

## 2017-01-31 NOTE — Assessment & Plan Note (Signed)
Incontinence of feces felt to be due to protruding hemorrhoids, noted internal and external hemorrhoids on his colonoscopy. He was sent to a surgeon for hemorrhoidectomy was told that it was really no problem and only a single hemorrhoid was banded in the office during a single session. He continues to have problems including rectal bleeding. He feels this is getting worse. At this point I will send an Anusol cream to see if we can help manage his hemorrhoids, we can offer rectal manometry to see if sphincter tone has any significant impact on his symptoms. Suspending his ability to afford as his deductible has recently resected. Return for follow-up in 3 months to further discuss plans and possible treatments.

## 2017-02-01 ENCOUNTER — Telehealth: Payer: Self-pay | Admitting: Gastroenterology

## 2017-02-01 NOTE — Telephone Encounter (Signed)
Noted  

## 2017-02-01 NOTE — Telephone Encounter (Signed)
Per the patient's request the procedure has been cancelled.

## 2017-02-02 ENCOUNTER — Encounter (HOSPITAL_COMMUNITY): Admission: RE | Payer: Self-pay | Source: Ambulatory Visit

## 2017-02-02 ENCOUNTER — Ambulatory Visit (HOSPITAL_COMMUNITY)
Admission: RE | Admit: 2017-02-02 | Payer: Managed Care, Other (non HMO) | Source: Ambulatory Visit | Admitting: Gastroenterology

## 2017-02-02 SURGERY — MANOMETRY, ANORECTAL

## 2017-05-02 ENCOUNTER — Encounter: Payer: Self-pay | Admitting: Gastroenterology

## 2017-05-02 ENCOUNTER — Ambulatory Visit: Payer: Managed Care, Other (non HMO) | Admitting: Gastroenterology

## 2017-05-02 VITALS — BP 143/78 | HR 93 | Temp 97.0°F | Ht 76.0 in | Wt 330.4 lb

## 2017-05-02 DIAGNOSIS — K625 Hemorrhage of anus and rectum: Secondary | ICD-10-CM | POA: Diagnosis not present

## 2017-05-02 NOTE — Patient Instructions (Signed)
Start using the anusol cream rectally twice a day for 7 days, then give it a break. You can repeat after a few days if needed.  Add Metamucil or Benefiber supplementation daily. If you notice gas or bloating, cut back on the dosage for a few days and then gradually increase.  I am getting the notes from the surgeon. You may need to have additional hemorrhoid banding. We will let you know in the near future!

## 2017-05-02 NOTE — Progress Notes (Signed)
cc'ed to pcp °

## 2017-05-02 NOTE — Progress Notes (Addendum)
REVIEWED-NO ADDITIONAL RECOMMENDATIONS.    Primary Care Physician:  Juliette AlcideBurdine, Steven E, MD Primary GI: Dr. Darrick PennaFields   Chief Complaint  Patient presents with  . Rectal Bleeding    w/ bm's/straining  . Hemorrhoids    HPI:   Frank Lucero is a 36 y.o. male presenting today with a history of IBS, chronic intermittent rectal bleeding and known internal/external hemorrhoids. He has had issues with fecal seepage. [facility] earlier this year with hemorrhoid banding X 1, from what I understand. I am requesting records. At last visit, he was referred for anorectal manometry due to fecal seepage, but he had to cancel due to out-of-pocket expense. Colonoscopy is up-to-date.   Notes intermittent rectal bleeding with BMs. Painless. No itching/burning. Off and on diarrhea. No constipation. Looser stool based on what he eats, usually every 3-4 days. 2-3 loose stools and then back to normal. Ibuprofen every few days. Not taking anusol cream currently. Still with fecal leekage.   Past Medical History:  Diagnosis Date  . GERD (gastroesophageal reflux disease) 06/25/09   EGD Dr Darrick PennaFields small HH, mild gastritis, duodenitis  . 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 Helicobacter pylori gastritis 06/2009   (SEROLOGY POS [facility]/ s/p treatment ), Bx NEG FEB 2011  . Hemorrhoids   . NASH (nonalcoholic steatohepatitis) JAN 2011 305 LBS, BMI 37.14 Aug 2009 ABD U/S-FATTY LIVER  . Obesity     Past Surgical History:  Procedure Laterality Date  . 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 COLONOSCOPY  10/2010   [id number]:Small internal hemorrhoids/Ulcers in the terminal ileum, etiology unclear/Normal colon  . COLONOSCOPY N/A 07/28/2016   Dr. Darrick PennaFields: diverticulosis, redundant left colon, internal and external hemorrhoids  . GIVENS CAPSULE STUDY  12/05/2010   SLF: NL TI AND COLON/Small internal hemorrhoids  . UPPER GASTROINTESTINAL ENDOSCOPY  FEB 2011 [place], [facility]   HH,  CHRONIC GASTRITIS, DUODENITIS    Current Outpatient Medications  Medication Sig Dispense Refill  . ibuprofen (ADVIL,MOTRIN) 200 MG tablet Take 600 mg by mouth every 8 (eight) hours as needed (for headache/pain.).    Marland Kitchen. imipramine (TOFRANIL) 10 MG tablet 3 PO QHS (Patient taking differently: Take 30 mg by mouth at bedtime. ) 90 tablet 11  . ketoconazole (NIZORAL) 2 % shampoo Apply 1 application topically 2 (two) times a week. 8 WEEK THERAPY COURSE    . omeprazole (PRILOSEC) 20 MG capsule Take 20 mg by mouth at bedtime.    . hydrocortisone (ANUSOL-HC) 2.5 % rectal cream Place 1 application rectally 2 (two) times daily. For up to 10 days at a time. (Patient not taking: Reported on 05/02/2017) 30 g 2   No current facility-administered medications for this visit.     Allergies as of 05/02/2017  . (No Known Allergies)    Family History  Problem Relation Age of Onset  . Colon cancer Maternal Grandfather 1470  . Colon cancer Paternal Grandfather     Social History   Socioeconomic History  . Marital status: Single    Spouse name: None  . Number of children: 2  . Years of education: None  . Highest education level: None  Social Needs  . Financial resource strain: None  . Food insecurity - worry: None  . Food insecurity - inability: None  . Transportation needs - medical: None  . Transportation needs - non-medical: None  Occupational History  .  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 Smoking status: Former Smoker    Packs/day: 1.00    Years: 18.00    Pack years: 18.00    Types: Cigarettes    Last attempt to quit: 11/10/2009    Years since quitting: 7.4  . Smokeless tobacco: Never Used  Substance and Sexual Activity  . Alcohol use: No  . Drug use: No  . Sexual activity: None  Other Topics Concern  . None  Social History Narrative  . None    Review of Systems: Gen: Denies fever, chills, anorexia. Denies fatigue, weakness, weight  loss.  CV: Denies chest pain, palpitations, syncope, peripheral edema, and claudication. Resp: Denies dyspnea at rest, cough, wheezing, coughing up blood, and pleurisy. GI: see HPI  Derm: Denies rash, itching, dry skin Psych: Denies depression, anxiety, memory loss, confusion. No homicidal or suicidal ideation.  Heme: see HPI   Physical Exam: BP (!) 143/78   Pulse 93   Temp (!) 97 F (36.1 C) (Oral)   Ht 6\' 4"  (1.93 m)   Wt (!) 330 lb 6.4 oz (149.9 kg)   BMI 40.22 kg/m  General:   Alert and oriented. No distress noted. Pleasant and cooperative.  Head:  Normocephalic and atraumatic. Eyes:  Conjuctiva clear without scleral icterus. Mouth:  Oral mucosa pink and moist.  Abdomen:  +BS, soft, non-tender and non-distended. No rebound or guarding. No HSM or masses noted. Msk:  Symmetrical without gross deformities. Normal posture. Extremities:  Without edema. Neurologic:  Alert and  oriented x4 Psych:  Alert and cooperative. Normal mood and affect.

## 2017-05-02 NOTE — Assessment & Plan Note (Signed)
36 year old male with history of IBS and chronic low-volume hematochezia in setting of known internal hemorrhoids as likely culprit. From his description, it sounds like he had internal hemorrhoid banding X 1 with [facility]. I am requesting notes. Likely needs additional banding. He will start using the anusol cream now. Fecal seepage likely secondary to known hemorrhoids. Add supplemental fiber daily. Further recommendations to follow.

## 2019-03-28 ENCOUNTER — Other Ambulatory Visit: Payer: Self-pay

## 2019-03-28 DIAGNOSIS — Z20822 Contact with and (suspected) exposure to covid-19: Secondary | ICD-10-CM

## 2019-03-29 LAB — NOVEL CORONAVIRUS, NAA: SARS-CoV-2, NAA: NOT DETECTED

## 2019-04-02 ENCOUNTER — Other Ambulatory Visit: Payer: Self-pay | Admitting: *Deleted

## 2019-04-02 DIAGNOSIS — Z20822 Contact with and (suspected) exposure to covid-19: Secondary | ICD-10-CM

## 2019-04-05 LAB — NOVEL CORONAVIRUS, NAA: SARS-CoV-2, NAA: DETECTED — AB

## 2019-08-26 NOTE — Progress Notes (Signed)
REVIEWED-NO ADDITIONAL RECOMMENDATIONS. 

## 2020-03-16 ENCOUNTER — Encounter: Payer: Self-pay | Admitting: Emergency Medicine

## 2020-03-16 ENCOUNTER — Ambulatory Visit
Admission: EM | Admit: 2020-03-16 | Discharge: 2020-03-16 | Disposition: A | Discharge status: Home / Self Care | Payer: BC Managed Care – PPO | Attending: Emergency Medicine | Admitting: Emergency Medicine

## 2020-03-16 ENCOUNTER — Other Ambulatory Visit: Payer: Self-pay

## 2020-03-16 DIAGNOSIS — T1502XA Foreign body in cornea, left eye, initial encounter: Secondary | ICD-10-CM | POA: Diagnosis not present

## 2020-03-16 MED ORDER — POLYMYXIN B-TRIMETHOPRIM 10000-0.1 UNIT/ML-% OP SOLN: 2.0000 [drp] | Freq: Four times a day (QID) | OPHTHALMIC | 0 refills | Status: DC

## 2020-03-16 NOTE — ED Triage Notes (Signed)
left eye irritation unsure if pt scratched his eye or has something in it started this morning

## 2020-03-16 NOTE — Discharge Instructions (Signed)
Begin polytrim drops  Follow-up with Dr. Alben Spittle tomorrow at 11 AM

## 2020-03-17 NOTE — ED Provider Notes (Signed)
[facility]    CSN: [id number] Arrival date & time: 03/16/20  1707      History   Chief Complaint No chief complaint on file. Left eye irritation  HPI Frank Lucero is a 39 y.o. male presenting today for evaluation of possible foreign body in eye.  Patient reports he was at work today, working underneath a car and he felt something fall into his eye.  Since he has had left eye irritation and sensation of foreign body.  Has had some tearing.  Denies any vision changes or photophobia.  Denies contact use.  HPI  Past Medical History:  Diagnosis Date   GERD (gastroesophageal reflux disease) 06/25/09   EGD Dr Darrick Penna small HH, mild gastritis, duodenitis   Sullivan Lone syndrome 2011 T BILI 1.6   2014: 7 BILI 1.4 IDBILI 1.2   Helicobacter pylori gastritis 06/2009   (SEROLOGY POS [facility]/ s/p treatment ), Bx NEG FEB 2011   Hemorrhoids    NASH (nonalcoholic steatohepatitis) JAN 2011 305 LBS, BMI 37.14 Aug 2009 ABD U/S-FATTY LIVER   Obesity     Patient Active Problem List   Diagnosis Date Noted   Hemorrhoids 07/12/2016   Incontinence of feces 06/13/2012   Ileitis, terminal (HCC) 12/06/2010   IBS (irritable bowel syndrome) 11/14/2010   Rectal bleeding 11/14/2010   Other chronic nonalcoholic liver disease 02/10/2010   GERD 06/11/2009   CONSTIPATION 06/11/2009    Past Surgical History:  Procedure Laterality Date   Benign Tumor     Right leg as a child   COLONOSCOPY  10/2010   [id number] internal hemorrhoids/Ulcers in the terminal ileum, etiology unclear/Normal colon   COLONOSCOPY N/A 07/28/2016   Dr. Darrick Penna: diverticulosis, redundant left colon, internal and external hemorrhoids   GIVENS CAPSULE STUDY  12/05/2010   SLF: NL TI AND COLON/Small internal hemorrhoids   UPPER GASTROINTESTINAL ENDOSCOPY  FEB 2011 [place], [facility]   HH, CHRONIC GASTRITIS, DUODENITIS       Home Medications    Prior to Admission medications   Medication Sig Start Date End Date  Taking? Authorizing Provider  hydrocortisone (ANUSOL-HC) 2.5 % rectal cream Place 1 application rectally 2 (two) times daily. For up to 10 days at a time. Patient not taking: Reported on 05/02/2017 01/31/17   Anice Paganini, NP  ibuprofen (ADVIL,MOTRIN) 200 MG tablet Take 600 mg by mouth every 8 (eight) hours as needed (for headache/pain.).    [provider]  imipramine (TOFRANIL) 10 MG tablet 3 PO QHS Patient taking differently: Take 30 mg by mouth at bedtime.  01/24/12   Fields, Darleene Cleaver, MD  ketoconazole (NIZORAL) 2 % shampoo Apply 1 application topically 2 (two) times a week. 8 WEEK THERAPY COURSE 06/12/16   [provider]  omeprazole (PRILOSEC) 20 MG capsule Take 20 mg by mouth at bedtime. 05/31/16   [provider]  trimethoprim-polymyxin b (POLYTRIM) ophthalmic solution Place 2 drops into the left eye every 6 (six) hours. 03/16/20   Idella Lamontagne, Junius Creamer, PA-C    Family History Family History  Problem Relation Age of Onset   Colon cancer Maternal Grandfather 85   Colon cancer Paternal Grandfather     Social History Social History   Tobacco Use   Smoking status: Former Smoker    Packs/day: 1.00    Years: 18.00    Pack years: 18.00    Types: Cigarettes    Quit date: 11/10/2009    Years since quitting: 10.3   Smokeless tobacco: Never Used  Substance Use Topics   Alcohol use: No   Drug use: No     Allergies   Patient has no known allergies.   Review of Systems Review of Systems  Constitutional: Negative for activity change, appetite change, chills, fatigue and fever.  HENT: Negative for congestion, ear pain, rhinorrhea, sinus pressure, sore throat and trouble swallowing.   Eyes: Positive for pain and redness. Negative for photophobia, discharge and visual disturbance.  Respiratory: Negative for cough, chest tightness and shortness of breath.   Cardiovascular: Negative for chest pain.  Gastrointestinal: Negative for abdominal pain, diarrhea,  nausea and vomiting.  Musculoskeletal: Negative for myalgias.  Skin: Negative for rash.  Neurological: Negative for dizziness, light-headedness and headaches.     Physical Exam Triage Vital Signs ED Triage Vitals  Enc Vitals Group     BP 03/16/20 1741 138/83     Pulse Rate 03/16/20 1741 73     Resp 03/16/20 1741 16     Temp 03/16/20 1741 98.7 F (37.1 C)     Temp Source 03/16/20 1741 Tympanic     SpO2 03/16/20 1741 97 %     Weight --      Height --      Head Circumference --      Peak Flow --      Pain Score 03/16/20 1753 0     Pain Loc --      Pain Edu? --      Excl. in GC? --    No data found.  Updated Vital Signs BP 138/83 (BP Location: Right Arm)    Pulse 73    Temp 98.7 F (37.1 C) (Tympanic)    Resp 16    SpO2 97%   Visual Acuity Right Eye Distance:   Left Eye Distance:   Bilateral Distance:    Right Eye Near:   Left Eye Near:    Bilateral Near:     Physical Exam Vitals and nursing note reviewed.  Constitutional:      Appearance: He is well-developed.     Comments: No acute distress  HENT:     Head: Normocephalic and atraumatic.     Nose: Nose normal.  Eyes:     Extraocular Movements: Extraocular movements intact.     Pupils: Pupils are equal, round, and reactive to light.     Comments: Mild erythema to left eye, black speck noted in right lower quadrant over iris, no other scratch or abrasion noted with florescien uptake  Cardiovascular:     Rate and Rhythm: Normal rate.  Pulmonary:     Effort: Pulmonary effort is normal. No respiratory distress.  Abdominal:     General: There is no distension.  Musculoskeletal:        General: Normal range of motion.     Cervical back: Neck supple.  Skin:    General: Skin is warm and dry.  Neurological:     Mental Status: He is alert and oriented to person, place, and time.      UC Treatments / Results  Labs (all labs ordered are listed, but only abnormal results are displayed) Labs Reviewed - No data  to display  EKG   Radiology No results found.  Procedures Procedures (including critical care time)  Medications Ordered in UC Medications - No data to display  Initial Impression / Assessment and Plan / UC Course  I have reviewed the triage vital signs and the nursing notes.  Pertinent labs & imaging results that were available  during my care of the patient were reviewed by me and considered in my medical decision making (see chart for details).     Appears to have foreign body embedded in cornea of left eye, call Dr. Alben Spittle, will have follow-up with him tomorrow at 11.  Placing on Polytrim eyedrops in the interim.  Discussed strict return precautions. Patient verbalized understanding and is agreeable with plan.  Final Clinical Impressions(s) / UC Diagnoses   Final diagnoses:  Foreign body of left cornea, initial encounter     Discharge Instructions     Begin polytrim drops  Follow-up with Dr. Alben Spittle tomorrow at 11 AM   ED Prescriptions    Medication Sig Dispense Auth. Provider   trimethoprim-polymyxin b (POLYTRIM) ophthalmic solution Place 2 drops into the left eye every 6 (six) hours. 10 mL Daichi Moris, [place] C, PA-C     PDMP not reviewed this encounter.   Verity Gilcrest, [place] C, PA-C 03/17/20 1057

## 2020-10-12 ENCOUNTER — Encounter (HOSPITAL_COMMUNITY): Payer: Self-pay

## 2020-10-12 ENCOUNTER — Emergency Department (HOSPITAL_COMMUNITY)
Admission: EM | Admit: 2020-10-12 | Discharge: 2020-10-13 | Disposition: A | Discharge status: Home / Self Care | Payer: BC Managed Care – PPO | Attending: Emergency Medicine | Admitting: Emergency Medicine

## 2020-10-12 ENCOUNTER — Other Ambulatory Visit: Payer: Self-pay

## 2020-10-12 DIAGNOSIS — Z87891 Personal history of nicotine dependence: Secondary | ICD-10-CM | POA: Insufficient documentation

## 2020-10-12 DIAGNOSIS — F4323 Adjustment disorder with mixed anxiety and depressed mood: Secondary | ICD-10-CM | POA: Diagnosis not present

## 2020-10-12 DIAGNOSIS — R45851 Suicidal ideations: Secondary | ICD-10-CM | POA: Diagnosis not present

## 2020-10-12 DIAGNOSIS — F419 Anxiety disorder, unspecified: Secondary | ICD-10-CM

## 2020-10-12 LAB — COMPREHENSIVE METABOLIC PANEL
ALT: 41 U/L (ref 0–44)
AST: 25 U/L (ref 15–41)
Albumin: 5 g/dL (ref 3.5–5.0)
Alkaline Phosphatase: 96 U/L (ref 38–126)
Anion gap: 10 (ref 5–15)
BUN: 14 mg/dL (ref 6–20)
CO2: 23 mmol/L (ref 22–32)
Calcium: 9.4 mg/dL (ref 8.9–10.3)
Chloride: 105 mmol/L (ref 98–111)
Creatinine, Ser: 0.74 mg/dL (ref 0.61–1.24)
GFR, Estimated: >60 mL/min (ref >60)
Glucose, Bld: 111 mg/dL — ABNORMAL HIGH (ref 70–99)
Potassium: 4 mmol/L (ref 3.5–5.1)
Sodium: 138 mmol/L (ref 135–145)
Total Bilirubin: 1.8 mg/dL — ABNORMAL HIGH (ref 0.3–1.2)
Total Protein: 8.4 g/dL — ABNORMAL HIGH (ref 6.5–8.1)

## 2020-10-12 LAB — CBC
HCT: 46.7 % (ref 39.0–52.0)
Hemoglobin: 15.7 g/dL (ref 13.0–17.0)
MCH: 29.5 pg (ref 26.0–34.0)
MCHC: 33.6 g/dL (ref 30.0–36.0)
MCV: 87.6 fL (ref 80.0–100.0)
Platelets: 313 10*3/uL (ref 150–400)
RBC: 5.33 MIL/uL (ref 4.22–5.81)
RDW: 12.5 % (ref 11.5–15.5)
WBC: 12.4 10*3/uL — ABNORMAL HIGH (ref 4.0–10.5)
nRBC: 0 % (ref 0.0–0.2)

## 2020-10-12 LAB — RAPID URINE DRUG SCREEN, HOSP PERFORMED
Amphetamines: NOT DETECTED
Barbiturates: NOT DETECTED
Benzodiazepines: NOT DETECTED
Cocaine: NOT DETECTED
Opiates: NOT DETECTED
Tetrahydrocannabinol: NOT DETECTED

## 2020-10-12 LAB — SALICYLATE LEVEL: Salicylate Lvl: <7 mg/dL — ABNORMAL LOW (ref 7.0–30.0)

## 2020-10-12 LAB — ETHANOL: Alcohol, Ethyl (B): <10 mg/dL (ref <10)

## 2020-10-12 LAB — ACETAMINOPHEN LEVEL: Acetaminophen (Tylenol), Serum: <10 ug/mL — ABNORMAL LOW (ref 10–30)

## 2020-10-12 MED ORDER — LORAZEPAM 1 MG PO TABS
1.0000 mg | ORAL_TABLET | Freq: Once | ORAL | Status: AC
  Administered 2020-10-12: 1 mg via ORAL
  Filled 2020-10-12: qty 1

## 2020-10-12 MED ORDER — PANTOPRAZOLE SODIUM 40 MG PO TBEC
40.0000 mg | DELAYED_RELEASE_TABLET | Freq: Every day | ORAL | Status: DC
  Administered 2020-10-12 – 2020-10-13 (×2): 40 mg via ORAL
  Filled 2020-10-12 (×2): qty 1

## 2020-10-12 MED ORDER — FLUOXETINE HCL 20 MG PO CAPS
20.0000 mg | ORAL_CAPSULE | Freq: Every day | ORAL | Status: DC
  Administered 2020-10-12 – 2020-10-13 (×2): 20 mg via ORAL
  Filled 2020-10-12 (×6): qty 1

## 2020-10-12 NOTE — ED Triage Notes (Signed)
Pt presents to ED with suicidal thoughts. Pt states he has had severe anxiety, worse since Friday. Pt states his doctor started him on some medication on Friday and since these thoughts have gotten worse. Pt denies specific plan.

## 2020-10-12 NOTE — ED Provider Notes (Signed)
[facility] EMERGENCY DEPARTMENT Provider Note   CSN: [id number] Arrival date & time: 10/12/20  3244     History Chief Complaint  Patient presents with  . V70.1    Frank Lucero is a 40 y.o. male with a history of Gilbert's syndrome, NASH, and GERD.  Patient presents with chief complaint of suicidal ideations.  Patient reports that he has had increased anxiety since Friday.  Patient reports that he started having suicidal ideations at that time.  Patient denies any plan for suicide.  Patient has never attempted suicide in the past.  Patient has not been had previous inpatient psychiatric admission.  Patient endorses anxiety and insomnia. Patient denies any homicidal ideations, auditory hallucinations, or visual hallucinations.  Patient reports he had recent change in medication.  Patient was previously described imipramine for anxiety.  Patient was taken off this medication on Friday and was supposed to start Prozac.  Patient reports that he was scared to start this new medication and has not taken it.  Patient denies any alcohol use or drug use.  Patient endorses he does have guns and weapons at home.  HPI     Past Medical History:  Diagnosis Date  . GERD (gastroesophageal reflux disease) 06/25/09   EGD Dr Darrick Penna small HH, mild gastritis, duodenitis  . 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 Helicobacter pylori gastritis 06/2009   (SEROLOGY POS [facility]/ s/p treatment ), Bx NEG FEB 2011  . Hemorrhoids   . NASH (nonalcoholic steatohepatitis) JAN 2011 305 LBS, BMI 37.14 Aug 2009 ABD U/S-FATTY LIVER  . Obesity     Patient Active Problem List   Diagnosis Date Noted  . Hemorrhoids 07/12/2016  . Incontinence of feces 06/13/2012  . 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 IBS (irritable bowel syndrome) 11/14/2010  . Rectal bleeding 11/14/2010  . Other chronic nonalcoholic liver disease 02/10/2010  . GERD 06/11/2009  . CONSTIPATION 06/11/2009    Past  Surgical History:  Procedure Laterality Date  . 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 COLONOSCOPY  10/2010   WNU:UVOZD internal hemorrhoids/Ulcers in the terminal ileum, etiology unclear/Normal colon  . COLONOSCOPY N/A 07/28/2016   Dr. Darrick Penna: diverticulosis, redundant left colon, internal and external hemorrhoids  . GIVENS CAPSULE STUDY  12/05/2010   SLF: NL TI AND COLON/Small internal hemorrhoids  . UPPER GASTROINTESTINAL ENDOSCOPY  FEB 2011 [place], [facility]   HH, CHRONIC GASTRITIS, DUODENITIS       Family History  Problem Relation Age of Onset  . Colon cancer Maternal Grandfather 28  . Colon cancer Paternal Grandfather     Social History   Tobacco Use  . Smoking status: Former Smoker    Packs/day: 1.00    Years: 18.00    Pack years: 18.00    Types: Cigarettes    Quit date: 11/10/2009    Years since quitting: 10.9  . Smokeless tobacco: Never Used  Substance Use Topics  . Alcohol use: No  . Drug use: No    Home Medications Prior to Admission medications   Medication Sig Start Date End Date Taking? Authorizing Provider  hydrocortisone (ANUSOL-HC) 2.5 % rectal cream Place 1 application rectally 2 (two) times daily. For up to 10 days at a time. Patient not taking: Reported on 05/02/2017 01/31/17   Anice Paganini, NP  ibuprofen (ADVIL,MOTRIN) 200 MG tablet Take 600 mg by mouth every 8 (eight) hours as needed (for  headache/pain.).    [provider]  imipramine (TOFRANIL) 10 MG tablet 3 PO QHS Patient taking differently: Take 30 mg by mouth at bedtime.  01/24/12   Fields, Darleene Cleaver, MD  ketoconazole (NIZORAL) 2 % shampoo Apply 1 application topically 2 (two) times a week. 8 WEEK THERAPY COURSE 06/12/16   [provider]  omeprazole (PRILOSEC) 20 MG capsule Take 20 mg by mouth at bedtime. 05/31/16   [provider]  trimethoprim-polymyxin b (POLYTRIM) ophthalmic solution Place 2 drops into the left eye every 6 (six) hours. 03/16/20   Wieters, Hallie C, PA-C     Allergies    Patient has no known allergies.  Review of Systems   Review of Systems  Constitutional: Negative for chills and fever.  Eyes: Negative for visual disturbance.  Respiratory: Negative for shortness of breath.   Cardiovascular: Negative for chest pain.  Gastrointestinal: Negative for abdominal pain, nausea and vomiting.  Genitourinary: Negative for difficulty urinating and dysuria.  Musculoskeletal: Negative for back pain and neck pain.  Skin: Negative for color change and rash.  Neurological: Negative for dizziness, syncope, light-headedness and headaches.  Psychiatric/Behavioral: Positive for sleep disturbance and suicidal ideas. Negative for confusion, hallucinations and self-injury. The patient is nervous/anxious.     Physical Exam Updated Vital Signs BP 139/88 (BP Location: Right Arm)   Pulse 90   Temp 98 F (36.7 C)   Resp 18   Ht 6\' 4"  (1.93 m)   Wt (!) 149.7 kg   SpO2 97%   BMI 40.17 kg/m   Physical Exam Vitals and nursing note reviewed.  Constitutional:      General: He is not in acute distress.    Appearance: He is not ill-appearing, toxic-appearing or diaphoretic.  HENT:     Head: Normocephalic.  Eyes:     General: No scleral icterus.       Right eye: No discharge.        Left eye: No discharge.  Cardiovascular:     Rate and Rhythm: Normal rate.     Heart sounds: Normal heart sounds.  Pulmonary:     Effort: Pulmonary effort is normal. No tachypnea, bradypnea or respiratory distress.     Breath sounds: Normal breath sounds. No stridor.  Abdominal:     General: There is no distension. There are no signs of injury.     Palpations: Abdomen is soft. There is no mass or pulsatile mass.     Tenderness: There is no abdominal tenderness.  Musculoskeletal:     Cervical back: Neck supple.     Comments: No injuries or lacerations to bilateral upper or lower extremities  Skin:    General: Skin is warm and dry.  Neurological:     General: No focal  deficit present.     Mental Status: He is alert.  Psychiatric:        Attention and Perception: He is attentive. He does not perceive auditory or visual hallucinations.        Speech: Speech normal.        Behavior: Behavior is cooperative.        Thought Content: Thought content is not paranoid or delusional. Thought content includes suicidal ideation. Thought content does not include homicidal ideation. Thought content does not include homicidal or suicidal plan.     Comments: Patient appears visibly anxious as he is frequently tapping his foot and arm     ED Results / Procedures / Treatments   Labs (all labs ordered  are listed, but only abnormal results are displayed) Labs Reviewed  COMPREHENSIVE METABOLIC PANEL - Abnormal; Notable for the following components:      Result Value   Glucose, Bld 111 (*)    Total Protein 8.4 (*)    Total Bilirubin 1.8 (*)    All other components within normal limits  SALICYLATE LEVEL - Abnormal; Notable for the following components:   Salicylate Lvl <7.0 (*)    All other components within normal limits  ACETAMINOPHEN LEVEL - Abnormal; Notable for the following components:   Acetaminophen (Tylenol), Serum <10 (*)    All other components within normal limits  CBC - Abnormal; Notable for the following components:   WBC 12.4 (*)    All other components within normal limits  RESP PANEL BY RT-PCR (FLU A&B, COVID) ARPGX2  ETHANOL  RAPID URINE DRUG SCREEN, HOSP PERFORMED    EKG EKG Interpretation  Date/Time:  Tuesday Oct 12 2020 11:36:07 EDT Ventricular Rate:  82 PR Interval:  148 QRS Duration: 102 QT Interval:  396 QTC Calculation: 462 R Axis:   74 Text Interpretation: Normal sinus rhythm Abnormal QRS-T angle, consider primary T wave abnormality Abnormal ECG No previous ECGs available Confirmed by Vanetta Mulders [phone]) on 10/12/2020 11:40:35 AM   Radiology No results found.  Procedures Procedures   Medications Ordered in  ED Medications  LORazepam (ATIVAN) tablet 1 mg (1 mg Oral Given 10/12/20 1325)    ED Course  I have reviewed the triage vital signs and the nursing notes.  Pertinent labs & imaging results that were available during my care of the patient were reviewed by me and considered in my medical decision making (see chart for details).    MDM Rules/Calculators/A&P                          Alert 40 year old male no acute stress, nontoxic-appearing.  Patient presents with chief complaint of suicidal ideation.  He endorses anxiety and insomnia.  Patient had recent change in his medication for anxiety.  Patient was.  Taking Prozac on Monday however has not.  Patient denies any plan for suicide at this time.  Patient denies any homicidal ideations, auditory hallucinations, visual hallucinations.  Medical clearance labs ordered.  Will give patient Ativan to help with his anxiety.  Patient is medically cleared at this time.  We will place TTS consult.  Home medications ordered    Final Clinical Impression(s) / ED Diagnoses Final diagnoses:  Suicidal ideations    Rx / DC Orders ED Discharge Orders    None       Berneice Heinrich 10/12/20 2207    Vanetta Mulders, MD 10/16/20 [phone]
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
## 2020-10-12 NOTE — ED Notes (Addendum)
Girlfriend, Harrold Donath. [phone] , she was given paper with info on visitor times.

## 2020-10-12 NOTE — ED Notes (Addendum)
See triage notes. Pt states he never started the med that was given to him over the weekend. Doesn't take any med for anxiety or depression at this time. Pt stated he is willing to be transferred if it would help. Denies hi/avh. States is having si but has no plan.

## 2020-10-12 NOTE — ED Notes (Signed)
All of pts belongings given to wife to take home.

## 2020-10-13 DIAGNOSIS — F419 Anxiety disorder, unspecified: Secondary | ICD-10-CM

## 2020-10-13 NOTE — ED Notes (Signed)
Patient's girlfriend updated on plan of care at this time vis verbal consent obtained from patient to update girlfriend Marylene Land for any patient needs, concerns, or plans.

## 2020-10-13 NOTE — ED Notes (Signed)
Patient alert and ambulated. Patient voices no complaints at this time. Patient to be reeval this morning via telepsych.

## 2020-10-13 NOTE — Consult Note (Signed)
Telepsych Consultation   Reason for Consult:  Psychiatric evaluation due to escalation of anxiety symptoms. Referring Physician:  Dr. Audley Hose  Location of Patient: [facility] Location of Provider: [facility]  Patient Identification: Frank Lucero MRN:  [id number] Principal Diagnosis: Anxiety disorder Diagnosis:  Principal Problem:   Anxiety disorder Active Problems:   Suicidal ideation   Total Time spent with patient: 20 minutes  Subjective:    Frank Lucero, 40 y.o., male patient seen via tele health by this provider, consulted with Dr. Lucianne Muss; and chart reviewed on 10/13/20.    Frank Lucero is a 40 y.o. male patient presenting to the [facility] for evaluation of escalating anxiety symptoms  HPI:   During evaluation Frank Lucero is laying in bed in no acute distress. He makes good eye contact and is pleasant. He is alert, oriented x 4, calm and cooperative.  His  mood is depressed with congruent affect. States, " I am more anxious than depressed".  He does not appear to be responding to internal/external stimuli or delusional thoughts.  Patient denies suicidal/self-harm/homicidal ideation, psychosis, and paranoia.  Patient answered question appropriately.  Patient reports that he works full time. States he was tired and he was triggered this past Friday with family drama. States he began having suicidal thoughts. States, "I don't have plan, I have never thought that far". States he would not do anything to hurt himself and he contracts for safety. States he would not himself because he hs children that look up to him. States he has anxiety and depression. States it comes and goes. Reports it normally resolves on its on. States that he discussed with PCP last Friday and was prescribed Prozac, but was afraid to take it. Sates he is afraid "it may make me worse". Reports he tried another medication in the past but did not know the name. States that medication made his  suicidal ideations worse. Denies any access to weapons/firearms.   Discussed coping skills with patient. Discussed crisis intervention including suicide risk factors. Discussed out patient psychiatric services including therapy, patient is willing to engage. Resources provided.  Collateral: attempted to call Marylene Land patients spouse was unsuccessful. States she is at work and would probably not be able to reach her.   Past Psychiatric History: psychiatric disorder of adjustment disorder  and depression  Risk to Self:  pt denies Risk to Others:  pt denies Prior Inpatient Therapy:  pt denies Prior Outpatient Therapy:  pt denies  Past Medical History:  Past Medical History:  Diagnosis Date  . GERD (gastroesophageal reflux disease) 06/25/09   EGD Dr Darrick Penna small HH, mild gastritis, duodenitis  . 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 Helicobacter pylori gastritis 06/2009   (SEROLOGY POS [facility]/ s/p treatment ), Bx NEG FEB 2011  . Hemorrhoids   . NASH (nonalcoholic steatohepatitis) JAN 2011 305 LBS, BMI 37.14 Aug 2009 ABD U/S-FATTY LIVER  . Obesity     Past Surgical History:  Procedure Laterality Date  . 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 COLONOSCOPY  10/2010   WUJ:WJXBJ internal hemorrhoids/Ulcers in the terminal ileum, etiology unclear/Normal colon  . COLONOSCOPY N/A 07/28/2016   Dr. Darrick Penna: diverticulosis, redundant left colon, internal and external hemorrhoids  . GIVENS CAPSULE STUDY  12/05/2010   SLF: NL TI AND COLON/Small internal hemorrhoids  . UPPER GASTROINTESTINAL ENDOSCOPY  FEB 2011 [place]   HH,  CHRONIC GASTRITIS, DUODENITIS   Family History:  Family History  Problem Relation Age of Onset  . Colon cancer Maternal Grandfather 50  . Colon cancer Paternal Grandfather    Family Psychiatric  History: unkown Social History:  Social History   Substance and Sexual Activity  Alcohol Use No     Social History   Substance and Sexual Activity   Drug Use No    Social History   Socioeconomic History  . Marital status: Single    Spouse name: Not on file  . Number of children: 2  . Years of education: Not on file  . Highest education level: Not on file  Occupational History  . 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 Smoking status: Former Smoker    Packs/day: 1.00    Years: 18.00    Pack years: 18.00    Types: Cigarettes    Quit date: 11/10/2009    Years since quitting: 10.9  . Smokeless tobacco: Never Used  Substance and Sexual Activity  . Alcohol use: No  . Drug use: No  . Sexual activity: Not on file  Other Topics Concern  . Not on file  Social History Narrative  . Not on file   Social Determinants of Health   Financial Resource Strain: Not on file  Food Insecurity: Not on file  Transportation Needs: Not on file  Physical Activity: Not on file  Stress: Not on file  Social Connections: Not on file   Additional Social History:    Allergies:  No Known Allergies  Labs:  Results for orders placed or performed during the hospital encounter of 10/12/20 (from the past 48 hour(s))  Rapid urine drug screen (hospital performed)     Status: None   Collection Time: 10/12/20 10:49 AM  Result Value Ref Range   Opiates NONE DETECTED NONE DETECTED   Cocaine NONE DETECTED NONE DETECTED   Benzodiazepines NONE DETECTED NONE DETECTED   Amphetamines NONE DETECTED NONE DETECTED   Tetrahydrocannabinol NONE DETECTED NONE DETECTED   Barbiturates NONE DETECTED NONE DETECTED    Comment: (NOTE) DRUG SCREEN FOR MEDICAL PURPOSES ONLY.  IF CONFIRMATION IS NEEDED FOR ANY PURPOSE, NOTIFY LAB WITHIN 5 DAYS.  LOWEST DETECTABLE LIMITS FOR URINE DRUG SCREEN Drug Class                     Cutoff (ng/mL) Amphetamine and metabolites    1000 Barbiturate and metabolites    200 Benzodiazepine                 200 Tricyclics and metabolites     300 Opiates and metabolites        300 Cocaine  and metabolites        300 THC                            50 Performed at [facility], [address], [place], [place] [address]   Comprehensive metabolic panel     Status: Abnormal   Collection Time: 10/12/20 11:44 AM  Result Value Ref Range   Sodium 138 135 - 145 mmol/L   Potassium 4.0 3.5 - 5.1 mmol/L   Chloride 105 98 - 111 mmol/L   CO2 23 22 - 32 mmol/L   Glucose, Bld 111 (H) 70 - 99 mg/dL    Comment: Glucose reference range applies only to samples taken after fasting for  at least 8 hours.   BUN 14 6 - 20 mg/dL   Creatinine, Ser 4.090.74 0.61 - 1.24 mg/dL   Calcium 9.4 8.9 - 81.110.3 mg/dL   Total Protein 8.4 (H) 6.5 - 8.1 g/dL   Albumin 5.0 3.5 - 5.0 g/dL   AST 25 15 - 41 U/L   ALT 41 0 - 44 U/L   Alkaline Phosphatase 96 38 - 126 U/L   Total Bilirubin 1.8 (H) 0.3 - 1.2 mg/dL   GFR, Estimated >91>60 >47>60 mL/min    Comment: (NOTE) Calculated using the CKD-EPI Creatinine Equation (2021)    Anion gap 10 5 - 15    Comment: Performed at [facility], [address] [place], [place] [address]  Ethanol     Status: None   Collection Time: 10/12/20 11:44 AM  Result Value Ref Range   Alcohol, Ethyl (B) <10 <10 mg/dL    Comment: (NOTE) Lowest detectable limit for serum alcohol is 10 mg/dL.  For medical purposes only. Performed at [facility], [address] [place], [place] [address]   Salicylate level     Status: Abnormal   Collection Time: 10/12/20 11:44 AM  Result Value Ref Range   Salicylate Lvl <7.0 (L) 7.0 - 30.0 mg/dL    Comment: Performed at [facility], [address] [place], [place] [address]  Acetaminophen level     Status: Abnormal   Collection Time: 10/12/20 11:44 AM  Result Value Ref Range   Acetaminophen (Tylenol), Serum <10 (L) 10 - 30 ug/mL    Comment: (NOTE) Therapeutic concentrations vary significantly. A range of 10-30 ug/mL  may be an effective concentration for many patients. However, some  are best treated at concentrations outside of this  range. Acetaminophen concentrations >150 ug/mL at 4 hours after ingestion  and >50 ug/mL at 12 hours after ingestion are often associated with  toxic reactions.  Performed at [facility], [address] [place], [place] [address]   cbc     Status: Abnormal   Collection Time: 10/12/20 11:44 AM  Result Value Ref Range   WBC 12.4 (H) 4.0 - 10.5 K/uL   RBC 5.33 4.22 - 5.81 MIL/uL   Hemoglobin 15.7 13.0 - 17.0 g/dL   HCT 52.846.7 41.339.0 - 24.452.0 %   MCV 87.6 80.0 - 100.0 fL   MCH 29.5 26.0 - 34.0 pg   MCHC 33.6 30.0 - 36.0 g/dL   RDW 01.012.5 27.211.5 - 53.615.5 %   Platelets 313 150 - 400 K/uL   nRBC 0.0 0.0 - 0.2 %    Comment: Performed at [facility], [address] [place], [place] [address]    Medications:  Current Facility-Administered Medications  Medication Dose Route Frequency Provider Last Rate Last Admin  . FLUoxetine (PROZAC) capsule 20 mg  20 mg Oral Daily Haskel SchroederBadalamente, Peter R, PA-C   20 mg at 10/13/20 [id number]  . pantoprazole (PROTONIX) EC tablet 40 mg  40 mg Oral Daily Haskel SchroederBadalamente, Peter R, PA-C   40 mg at 10/13/20 [id number]   Current Outpatient Medications  Medication Sig Dispense Refill  . FLUoxetine (PROZAC) 20 MG tablet Take 20 mg by mouth daily.    Marland Kitchen. omeprazole (PRILOSEC) 20 MG capsule Take 20 mg by mouth at bedtime.    . hydrocortisone (ANUSOL-HC) 2.5 % rectal cream Place 1 application rectally 2 (two) times daily. For up to 10 days at a time. (Patient not taking: No sig reported) 30 g 2  . imipramine (TOFRANIL) 10 MG tablet 3 PO  QHS (Patient not taking: Reported on 10/12/2020) 90 tablet 11  . trimethoprim-polymyxin b (POLYTRIM) ophthalmic solution Place 2 drops into the left eye every 6 (six) hours. (Patient not taking: Reported on 10/12/2020) 10 mL 0    Musculoskeletal: Strength & Muscle Tone: within normal limits Gait & Station: normal Patient leans: N/A  Psychiatric Specialty Exam: Physical Exam Vitals reviewed.  Constitutional:      Appearance: Normal appearance.  HENT:      Head: Normocephalic.     Right Ear: External ear normal.     Left Ear: External ear normal.     Nose: No congestion.     Mouth/Throat:     Pharynx: Oropharynx is clear.  Eyes:     Conjunctiva/sclera: Conjunctivae normal.  Cardiovascular:     Rate and Rhythm: Normal rate.     Pulses: Normal pulses.  Pulmonary:     Effort: Pulmonary effort is normal.  Abdominal:     Tenderness: There is no guarding.  Musculoskeletal:        General: Normal range of motion.     Cervical back: Normal range of motion.  Neurological:     Mental Status: He is alert and oriented to person, place, and time.  Psychiatric:        Attention and Perception: Attention and perception normal.        Mood and Affect: Mood is anxious and depressed.        Speech: Speech normal.        Behavior: Behavior normal.        Thought Content: Thought content includes suicidal ideation. Thought content does not include suicidal plan.        Cognition and Memory: Cognition normal.        Judgment: Judgment normal.     Review of Systems  Constitutional: Negative.   HENT: Negative.   Eyes: Negative.   Respiratory: Negative.   Cardiovascular: Negative.   Gastrointestinal: Negative.   Endocrine: Negative.   Genitourinary: Negative.   Musculoskeletal: Negative.   Skin: Negative.   Allergic/Immunologic: Negative.   Neurological: Negative.   Hematological: Negative.   Psychiatric/Behavioral: Negative.     Blood pressure 124/79, pulse 79, temperature 98.2 F (36.8 C), temperature source Oral, resp. rate 17, height 6\' 4"  (1.93 m), weight (!) 149.7 kg, SpO2 97 %.Body mass index is 40.17 kg/m.  General Appearance: Fairly Groomed  Eye Contact:  Good  Speech:  Clear and Coherent and Normal Rate  Volume:  Normal  Mood:  Anxious and Depressed  Affect:  Congruent  Thought Process:  Coherent and Goal Directed  Orientation:  Full (Time, Place, and Person)  Thought Content:  Logical  Suicidal Thoughts:  Yes.  without  intent/plan  Homicidal Thoughts:  No  Memory:  Immediate;   Good Recent;   Good Remote;   Good  Judgement:  Good  Insight:  Good  Psychomotor Activity:  Normal  Concentration:  Concentration: Good and Attention Span: Good  Recall:  Good  Fund of Knowledge:  Good  Language:  Good  Akathisia:  No  Handed:  Right  AIMS (if indicated):     Assets:  Communication Skills Desire for Improvement Financial Resources/Insurance Housing Leisure Time Physical Health Resilience Social Support Transportation Vocational/Educational  ADL's:  Intact  Cognition:  WNL  Sleep:   7 hours     Treatment Plan Summary:  Out patient resources for psychiatric services attached to AVS by .   Medication Recommendations: Continue Prozac 20 mg  PO QD  Add Vistaril 25 mg PO TID PRN for anxiety   Disposition: No evidence of imminent risk to self or others at present.   Patient does not meet criteria for psychiatric inpatient admission. Supportive therapy provided about ongoing stressors. Discussed crisis plan, support from social network, calling 911, coming to the Emergency Department, and calling Suicide Hotline.  This service was provided via telemedicine using a 2-way, interactive audio and video technology.  Names of all persons participating in this telemedicine service and their role in this encounter. Name: Frank Lucero Role: Patient  Name: Vernard Gambles  Role: NP  Name:  Role:   Name:  Role:   EDP Dr. Audley Hose and Charge RN notified.   Ardis Hughs, NP 10/13/2020 2:25 PM

## 2020-10-13 NOTE — Progress Notes (Addendum)
Patient has been seen by psychiatry and has been psychiatrically cleared.  I have asked Social Work to place resources for out patient psychiatric services on the AVS.  Med recommendations on discharge:  Prozac 20 mg PO QD  Add Vistaril 25 mg PO TID PRN for anxiety

## 2020-12-31 ENCOUNTER — Other Ambulatory Visit: Payer: Self-pay

## 2020-12-31 ENCOUNTER — Encounter (HOSPITAL_COMMUNITY): Payer: Self-pay | Admitting: *Deleted

## 2020-12-31 ENCOUNTER — Emergency Department (HOSPITAL_COMMUNITY)
Admission: EM | Admit: 2020-12-31 | Discharge: 2021-01-01 | Disposition: A | Discharge status: Home / Self Care | Payer: BC Managed Care – PPO | Attending: Emergency Medicine | Admitting: Emergency Medicine

## 2020-12-31 ENCOUNTER — Emergency Department (HOSPITAL_COMMUNITY): Payer: BC Managed Care – PPO

## 2020-12-31 DIAGNOSIS — Z79899 Other long term (current) drug therapy: Secondary | ICD-10-CM | POA: Diagnosis not present

## 2020-12-31 DIAGNOSIS — F419 Anxiety disorder, unspecified: Secondary | ICD-10-CM

## 2020-12-31 DIAGNOSIS — Y900 Blood alcohol level of less than 20 mg/100 ml: Secondary | ICD-10-CM | POA: Diagnosis not present

## 2020-12-31 DIAGNOSIS — Z87891 Personal history of nicotine dependence: Secondary | ICD-10-CM | POA: Insufficient documentation

## 2020-12-31 DIAGNOSIS — R079 Chest pain, unspecified: Secondary | ICD-10-CM | POA: Insufficient documentation

## 2020-12-31 LAB — CBC
HCT: 42.9 % (ref 39.0–52.0)
Hemoglobin: 14.5 g/dL (ref 13.0–17.0)
MCH: 29.8 pg (ref 26.0–34.0)
MCHC: 33.8 g/dL (ref 30.0–36.0)
MCV: 88.1 fL (ref 80.0–100.0)
Platelets: 246 10*3/uL (ref 150–400)
RBC: 4.87 MIL/uL (ref 4.22–5.81)
RDW: 12.6 % (ref 11.5–15.5)
WBC: 11 10*3/uL — ABNORMAL HIGH (ref 4.0–10.5)
nRBC: 0 % (ref 0.0–0.2)

## 2020-12-31 LAB — BASIC METABOLIC PANEL
Anion gap: 7 (ref 5–15)
BUN: 13 mg/dL (ref 6–20)
CO2: 26 mmol/L (ref 22–32)
Calcium: 8.7 mg/dL — ABNORMAL LOW (ref 8.9–10.3)
Chloride: 103 mmol/L (ref 98–111)
Creatinine, Ser: 0.76 mg/dL (ref 0.61–1.24)
GFR, Estimated: >60 mL/min (ref >60)
Glucose, Bld: 110 mg/dL — ABNORMAL HIGH (ref 70–99)
Potassium: 3.7 mmol/L (ref 3.5–5.1)
Sodium: 136 mmol/L (ref 135–145)

## 2020-12-31 LAB — ETHANOL: Alcohol, Ethyl (B): <10 mg/dL (ref <10)

## 2020-12-31 LAB — ACETAMINOPHEN LEVEL: Acetaminophen (Tylenol), Serum: <10 ug/mL — ABNORMAL LOW (ref 10–30)

## 2020-12-31 LAB — SALICYLATE LEVEL: Salicylate Lvl: <7 mg/dL — ABNORMAL LOW (ref 7.0–30.0)

## 2020-12-31 NOTE — ED Notes (Signed)
TTS in progress 

## 2020-12-31 NOTE — ED Provider Notes (Signed)
[facility] EMERGENCY DEPARTMENT Provider Note   CSN: [id number] Arrival date & time: 12/31/20  2213     History Chief Complaint  Patient presents with   Chest Pain    Frank Lucero is a 40 y.o. male.   Chest Pain     Pt is a 40 y/o male - arrives with severe anxiety - has been on Clonazepam and has had increased dosage now taking 2mg  tid.  He has some good days and bad days - per the GF.  He reports no SI and she corroborates this - he has been out of work on due to his anxiety - gets some erlief with taking meds - but then it wears off and he is more anxious.  Today was worse - but the calmed down - and then got worse.    Reports that he has multiple family members with schizophrenia.  He has not been diagnosed with this.  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   2014: 7 BILI 1.4 IDBILI 1.2   Helicobacter pylori gastritis 06/2009   (SEROLOGY POS [facility]/ s/p treatment ), Bx NEG FEB 2011   Hemorrhoids    NASH (nonalcoholic steatohepatitis) JAN 2011 305 LBS, BMI 37.14 Aug 2009 ABD U/S-FATTY LIVER   Obesity     Patient Active Problem List   Diagnosis Date Noted   Anxiety disorder 10/13/2020   Adjustment disorder with mixed anxiety and depressed mood    Suicidal ideation    Hemorrhoids 07/12/2016   Incontinence of feces 06/13/2012   Ileitis, 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 09/27/2016: diverticulosis, redundant left colon,  internal and external hemorrhoids   GIVENS CAPSULE STUDY  12/05/2010   SLF: NL TI AND COLON/Small internal hemorrhoids   UPPER GASTROINTESTINAL ENDOSCOPY  FEB 2011 [facility]   HH, CHRONIC GASTRITIS, DUODENITIS       Family History  Problem Relation Age of Onset   Colon cancer Maternal Grandfather 78   Colon cancer Paternal Grandfather     Social History   Tobacco Use   Smoking status: Former    Packs/day: 1.00    Years: 18.00    Pack years: 18.00    Types: Cigarettes    Quit date: 11/10/2009    Years since quitting: 11.1   Smokeless tobacco: Never  Substance Use Topics   Alcohol use: No   Drug use: No    Home Medications Prior to Admission medications   Medication Sig Start Date End Date Taking? Authorizing Provider  clonazePAM (KLONOPIN) 1 MG tablet Take 1 mg by mouth 3 (three) times daily.   Yes [provider]  omeprazole (PRILOSEC) 20 MG capsule Take 20 mg by mouth at bedtime. 05/31/16  Yes [provider]  FLUoxetine (PROZAC) 20 MG tablet Take 20 mg by mouth daily. Patient not taking: Reported on 12/31/2020 10/11/20   [provider]  hydrocortisone (ANUSOL-HC) 2.5 % rectal cream  Place 1 application rectally 2 (two) times daily. For up to 10 days at a time. Patient not taking: No sig reported 01/31/17   Anice Paganini, NP  imipramine (TOFRANIL) 10 MG tablet 3 PO QHS Patient not taking: No sig reported 01/24/12   West Bali, MD  trimethoprim-polymyxin b (POLYTRIM) ophthalmic solution Place 2 drops into the left eye every 6 (six) hours. Patient not taking: No sig reported 03/16/20   Wieters, Hallie C, PA-C    Allergies    Niacin and related  Review of Systems   Review of Systems  Cardiovascular:  Positive for chest pain.  All other systems reviewed and are negative.  Physical Exam Updated Vital Signs BP 132/72   Pulse 72   Temp 98 F (36.7 C) (Oral)   Resp (!) 29   Ht 1.93 m (6\' 4" )   Wt (!) 154.2 kg   SpO2 95%   BMI 41.39 kg/m    Physical Exam Vitals and nursing note reviewed.  Constitutional:      General: He is not in acute distress.    Appearance: He is well-developed.  HENT:     Head: Normocephalic and atraumatic.     Mouth/Throat:     Pharynx: No oropharyngeal exudate.  Eyes:     General: No scleral icterus.       Right eye: No discharge.        Left eye: No discharge.     Conjunctiva/sclera: Conjunctivae normal.     Pupils: Pupils are equal, round, and reactive to light.  Neck:     Thyroid: No thyromegaly.     Vascular: No JVD.  Cardiovascular:     Rate and Rhythm: Normal rate and regular rhythm.     Heart sounds: Normal heart sounds. No murmur heard.   No friction rub. No gallop.  Pulmonary:     Effort: Pulmonary effort is normal. No respiratory distress.     Breath sounds: Normal breath sounds. No wheezing or rales.  Abdominal:     General: Bowel sounds are normal. There is no distension.     Palpations: Abdomen is soft. There is no mass.     Tenderness: There is no abdominal tenderness.  Musculoskeletal:        General: No tenderness. Normal range of motion.     Cervical back: Normal range of motion and neck supple.  Lymphadenopathy:     Cervical: No cervical adenopathy.  Skin:    General: Skin is warm and dry.     Findings: No erythema or rash.  Neurological:     Mental Status: He is alert.     Coordination: Coordination normal.     Comments: Occasional tremor Speech clear, no droop, normal strength.  Psychiatric:        Behavior: Behavior normal.     Comments: No internal stimuli Appears anxious No suicidal thorughts    ED Results / Procedures / Treatments   Labs (all labs ordered are listed, but only abnormal results are displayed) Labs Reviewed  CBC - Abnormal; Notable for the following components:      Result Value   WBC 11.0 (*)    All other components within normal limits  BASIC METABOLIC PANEL  ETHANOL  ACETAMINOPHEN LEVEL  SALICYLATE LEVEL    EKG EKG  Interpretation  Date/Time:  Friday December 31 2020 22:23:54 EDT Ventricular Rate:  82 PR Interval:  171 QRS Duration: 111 QT Interval:  372 QTC Calculation: 435 R Axis:   -  34 Text Interpretation: Sinus rhythm Left axis deviation Low voltage, precordial leads since last tracing no significant change Confirmed by Eber Hong ([id number]) on 12/31/2020 10:34:43 PM  Radiology DG Chest Portable 1 View  Result Date: 12/31/2020 CLINICAL DATA:  Chest pain EXAM: PORTABLE CHEST 1 VIEW COMPARISON:  None. FINDINGS: The heart size and mediastinal contours are within normal limits. Both lungs are clear. The visualized skeletal structures are unremarkable. IMPRESSION: No active disease. Electronically Signed   By: Frank Pang M.D.   On: 12/31/2020 22:53    Procedures Procedures   Medications Ordered in ED Medications - No data to display  ED Course  I have reviewed the triage vital signs and the nursing notes.  Pertinent labs & imaging results that were available during my care of the patient were reviewed by me and considered in my medical decision making (see chart for details).    MDM Rules/Calculators/A&P                           Again with escalating anxiety -  Took clonopin at 4 AM and another pill at 12 PM and then at 4 PM it started again - took  another tablet at 6 PM. He has been seemingly ongoingly anxious.   C/o chest pain, hot flashes -0 each episodes are similar - joint pain - chest pain - walking around and can't get comfortable - complains of dry mouth during this time.    Went to [employer] today - sat for 2 hours - during lunch time - he said that he  couldn't afford it and they left.    Sig other states that she called around and was told that she was told that they didn't have any spots available.  No discussion of suicide recently.    Sounds like panic attacks R/o other sources.   I have asked for a psychiatric consultation at 11:20 PM, the patient is truly medically  cleared for evaluation by psychiatry  At change of shift, care signed out to oncoming physician Dr. Judd Lien to follow-up recommendations and disposition accordingly  Final Clinical Impression(s) / ED Diagnoses Final diagnoses:  Severe anxiety    Rx / DC Orders ED Discharge Orders     None          Eber Hong, MD 12/31/20 2320

## 2020-12-31 NOTE — ED Triage Notes (Signed)
Pt with mid chest heaviness x 4 hours, pt with shaking during triage and pt states it started at time chest heaviness started.  Pt on anxiety medication and recently adjusted dose per wife. +nausea and mild SOB + HA and joint pain

## 2021-01-01 MED ORDER — LORAZEPAM 1 MG PO TABS: 2.0000 mg | ORAL_TABLET | Freq: Once | ORAL | Status: DC

## 2021-01-01 MED ORDER — ACETAMINOPHEN 500 MG PO TABS: 1000.0000 mg | ORAL_TABLET | Freq: Once | ORAL | Status: DC

## 2021-01-01 NOTE — ED Provider Notes (Signed)
  Physical Exam  BP 132/72   Pulse 72   Temp 98 F (36.7 C) (Oral)   Resp (!) 29   Ht 6\' 4"  (1.93 m)   Wt (!) 154.2 kg   SpO2 95%   BMI 41.39 kg/m   Physical Exam Vitals and nursing note reviewed.  Constitutional:      General: He is not in acute distress.    Appearance: He is well-developed. He is not diaphoretic.  HENT:     Head: Normocephalic and atraumatic.  Cardiovascular:     Rate and Rhythm: Normal rate and regular rhythm.     Heart sounds: No murmur heard.   No friction rub.  Pulmonary:     Effort: Pulmonary effort is normal. No respiratory distress.     Breath sounds: Normal breath sounds. No wheezing or rales.  Abdominal:     General: Bowel sounds are normal. There is no distension.     Palpations: Abdomen is soft.     Tenderness: There is no abdominal tenderness.  Musculoskeletal:        General: Normal range of motion.     Cervical back: Normal range of motion and neck supple.  Skin:    General: Skin is warm and dry.  Neurological:     Mental Status: He is alert and oriented to person, place, and time.     Coordination: Coordination normal.    ED Course/Procedures     Procedures  MDM  Patient care assumed from Dr. at shift change.  Patient presenting here with complaints of anxiety.  According to the patient, he has been dysfunctional related to his panic attacks and anxiety.  He has been unable to work related to this.  Care was signed out to me awaiting results of TTS consultation.  Patient has been evaluated by TTS who feels as though he is at moderate suicide risk and meets inpatient criteria.  I have discussed this with the patient himself, and he feels as though admission is reasonable although he denies to me that he is suicidal or had suicidal plan.       Hyacinth Meeker, MD 01/01/21 [phone]
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
## 2021-01-01 NOTE — ED Notes (Addendum)
Pt [facility] that [facility] Judd Lien in to talk with the pt and stated that pt was able to leave. Pt discharged to home by Wendi Maya, RN.

## 2021-12-30 IMAGING — DX DG CHEST 1V PORT
1 series · 1 of 1 positions shown · non-contrast
Comparison: None.

CLINICAL DATA: Chest pain

EXAM:
PORTABLE CHEST 1 VIEW

[chest ap grid]
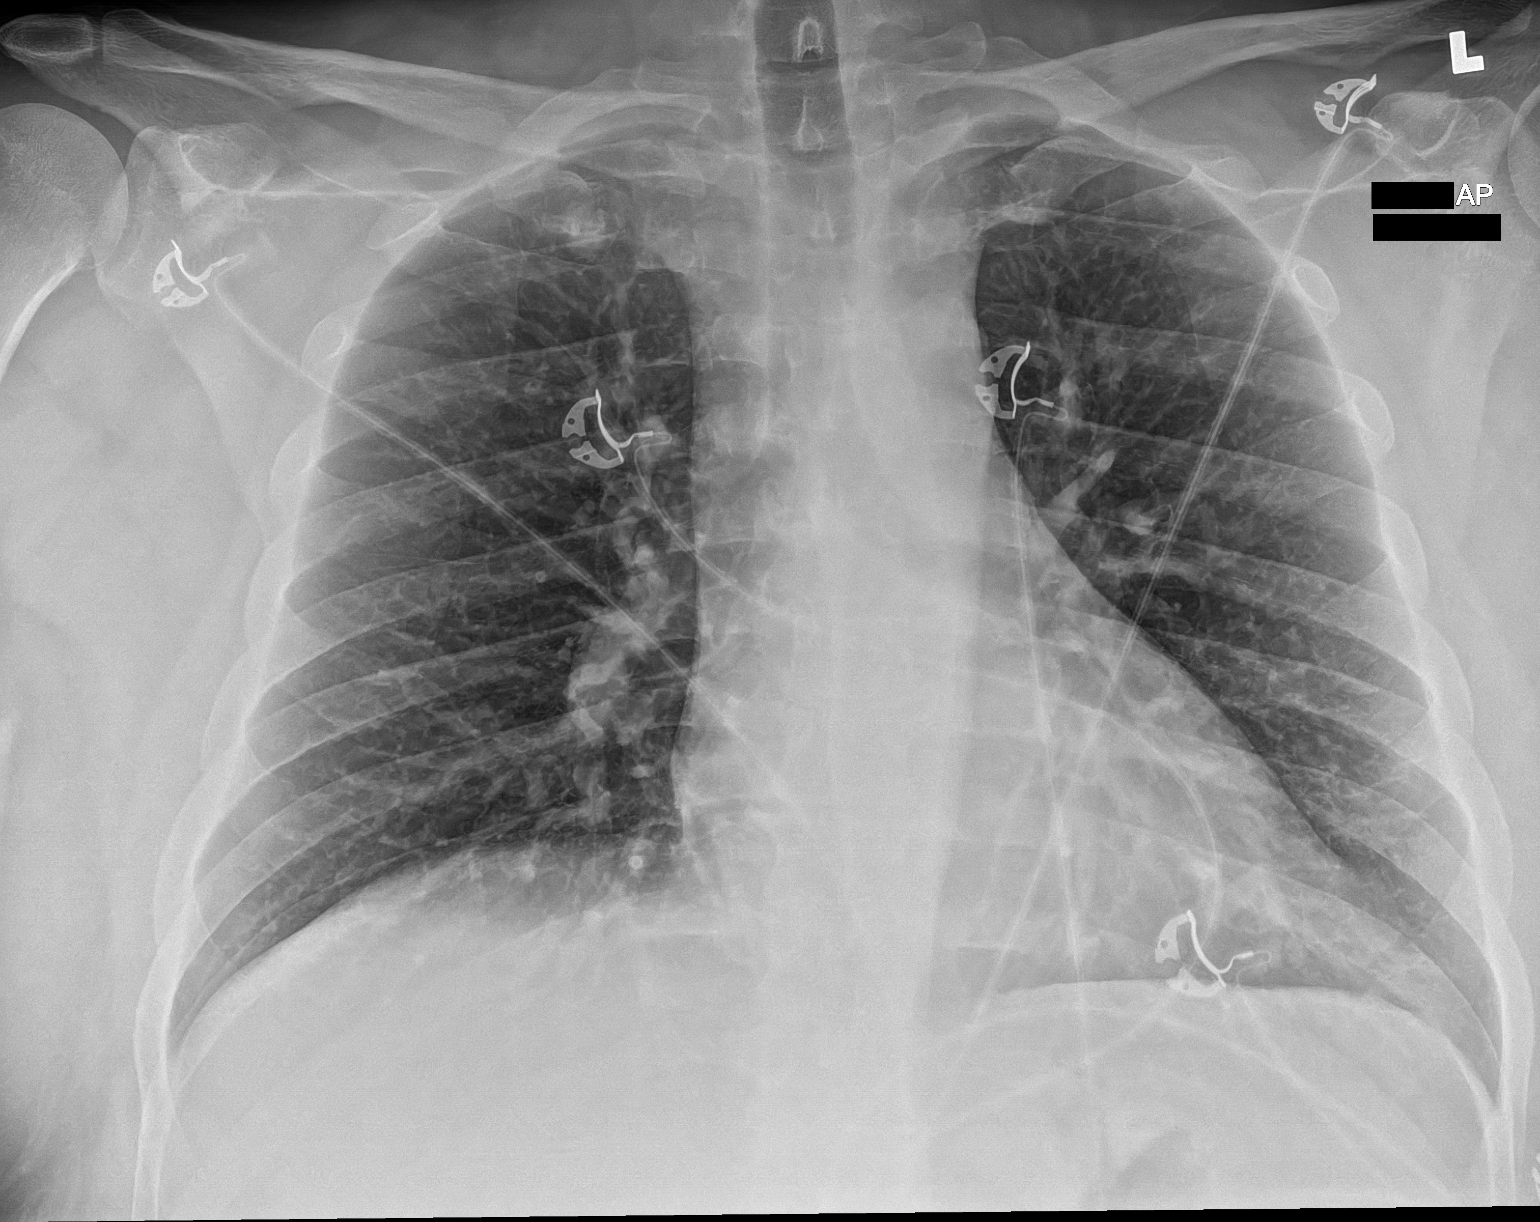

[1 of 1 positions shown; findings below may reference images not displayed]

FINDINGS: The heart size and mediastinal contours are within normal limits.
Both lungs are clear. The visualized skeletal structures are
unremarkable.
IMPRESSION: No active disease.

## 2022-12-04 ENCOUNTER — Encounter: Payer: Self-pay | Admitting: Nutrition

## 2022-12-04 ENCOUNTER — Encounter: Payer: 59 | Attending: Family Medicine | Admitting: Nutrition

## 2022-12-04 VITALS — Ht 76.0 in | Wt 313.0 lb

## 2022-12-04 DIAGNOSIS — E118 Type 2 diabetes mellitus with unspecified complications: Secondary | ICD-10-CM | POA: Insufficient documentation

## 2022-12-04 DIAGNOSIS — E782 Mixed hyperlipidemia: Secondary | ICD-10-CM | POA: Insufficient documentation

## 2022-12-04 DIAGNOSIS — E781 Pure hyperglyceridemia: Secondary | ICD-10-CM | POA: Insufficient documentation

## 2022-12-04 NOTE — Patient Instructions (Addendum)
Goals  Increase more whole plant based foods at all meals Cut out fast foods, processed foods Keep drinking water- a gallon of water Keep working out daily  Test blood sugar before breakfast and before bedtime. Aim for am blood sugars less than 130 and Bedtime less than 180 for now. Start taking Metformin one after breakfast and one after supper. Focus on Lifestyle Medicine and whole plant based foods Watch FullPlateliving program online. Ask MD about a Continuous Glucose Monitor to manage your diabetes. Get A1C down to 7% or less.

## 2022-12-04 NOTE — Progress Notes (Signed)
Medical Nutrition Therapy  Appointment Start time:  1345  Appointment End time:  1545  Primary concerns today: New DX Type 2 DM, Hyperlipidemia Referral diagnosis: E11.8, E78.0 Preferred learning style: No Preference  Learning readiness:  Change in progress.    NUTRITION ASSESSMENT  42 yr old wmale here with his wife. Referred due to newly diagnosed Type 2 DM. A1C 12%. Currently testing bloods sugars 2-3 times per day. BS are in the 200's. Is currently taking Metformin 500 mg once a day and will increase to 500 mg BID today. Saw PCP Dr. Leandrew Koyanagi today and was prescribed Ozempic. He notes he doesn't want to  take medications.  He notes he has ongoing IBS and  taking Ozempic may effect his gut motility and gastroparesis and may be contraindicated. He wants to reverse his DM with committing to behavior and lifestyle changes with diet and exercise to reverse his DM and Hyperlipidemia. He is willing to stay on the Metformin for now..   He notes he was having increased thirst, frequent urination, blurry vision, chronic fatigue, poor sleep, and hunger and diarrhea at times. He lost 27 lbs in the last month or so unexpectedly.   PMH: Obesity, IBS, Hyperlipidemia and Depression. He was on an antidepressant and had to come off of it due to making him 'crazy." He is not on an medication for mental health.   He notes he was laid off his job 1-1/2 years ago and his health and diet has been terrible since then. He notes he and his wife eat a lot of processed foods. Doesn't cook at home very much.  UBW 330's and lost unexpectedly to 302 lbs. Wt now is 313 lbs.  FBS:270-280's in mornings and  200's in evenings. UBW 230 lbs before dx of Type 2 DM.  11-22-22 Glu 403 mg/dl, ALK Phos 161 mg/dl, TCHOL 096 mg/dl, TG 0454 mg/dl, LDL 098 mg/dl and HDL 31 mg/dl. A1C 12%.  Does not have a CGM at present. He would benefit from a CGM.  He is committed to work on Lifestyle Medicine by changing his eating habits and  lifestyle.Marland Kitchen He started working out daily at [employer] He has cut out a lot of junk food and processed foods. Is working on eating more vegetables. Is only drinking water.   Anthropometrics  Wt Readings from Last 3 Encounters:  12/31/20 (!) 340 lb (154.2 kg)  10/12/20 (!) 330 lb (149.7 kg)  05/02/17 (!) 330 lb 6.4 oz (149.9 kg)   Ht Readings from Last 3 Encounters:  12/31/20 6\' 4"  (1.93 m)  10/12/20 6\' 4"  (1.93 m)  05/02/17 6\' 4"  (1.93 m)   There is no height or weight on file to calculate BMI. @BMIFA @ Facility age limit for growth %iles is 20 years. Facility age limit for growth %iles is 20 years.    Clinical Medical Hx: See chart Medications: Metformin 500 mg BID. Is not going to start the Ozempic for now. Labs:  []  eGFR mL/min/1.73 102       Notable Signs/Symptoms: Increased thirst, frequent urination, weight loss, chronic fatigue, blurry vision  Lifestyle & Dietary Hx Lives with his wife and kids. Out of work right now. Eats out often. Doesn't like to cook. Going to gym daily.  Estimated daily fluid intake: 100 oz Supplements: MVI Sleep: Fair Stress / self-care: his new diagnosis of DM and being out of a job Current average weekly physical activity: going to gym daily now.  24-Hr Dietary Recall Eats 3 meals  per day. Drinks water. Foods are more proccessed and fast foods.  Estimated Energy Needs Calories: 2000 Carbohydrate: 225g Protein: 150g Fat: 56g   NUTRITION DIAGNOSIS  NB-1.1 Food and nutrition-related knowledge deficit As related to Diabetes Type 2 .  As evidenced by A1C 12%.   NUTRITION INTERVENTION  Nutrition education (E-1) on the following topics:  Nutrition and Diabetes education provided on My Plate, CHO counting, meal planning, portion sizes, timing of meals, avoiding snacks between meals unless having a low blood sugar, target ranges for A1C and blood sugars, signs/symptoms and treatment of hyper/hypoglycemia, monitoring blood sugars, taking  medications as prescribed, benefits of exercising 30 minutes per day and prevention of complications of DM.  Lifestyle Medicine  - Whole Food, Plant Predominant Nutrition is highly recommended: Eat Plenty of vegetables, Mushrooms, fruits, Legumes, Whole Grains, Nuts, seeds in lieu of processed meats, processed snacks/pastries red meat, poultry, eggs.    -It is better to avoid simple carbohydrates including: Cakes, Sweet Desserts, Ice Cream, Soda (diet and regular), Sweet Tea, Candies, Chips, Cookies, Store Bought Juices, Alcohol in Excess of  1-2 drinks a day, Lemonade,  Artificial Sweeteners, Doughnuts, Coffee Creamers, "Sugar-free" Products, etc, etc.  This is not a complete list.....  Exercise: If you are able: 30 -60 minutes a day ,4 days a week, or 150 minutes a week.  The longer the better.  Combine stretch, strength, and aerobic activities.  If you were told in the past that you have high risk for cardiovascular diseases, you may seek evaluation by your heart doctor prior to initiating moderate to intense exercise programs.   Handouts Provided Include  Lifestyle Medicine Know your numbers   Learning Style & Readiness for Change Teaching method utilized: Visual & Auditory  Demonstrated degree of understanding via: Teach Back  Barriers to learning/adherence to lifestyle change: None  Goals Established by Pt Goals  Increase more whole plant based foods at all meals Cut out fast foods, processed foods Keep drinking water- a gallon of water Keep working out daily  Test blood sugar before breakfast and before bedtime. Aim for am blood sugars less than 130 and Bedtime less than 180 for now. Start taking Metformin one after breakfast and one after supper. Focus on Lifestyle Medicine and whole plant based foods Watch FullPlateliving program online. Ask MD about a Continuous Glucose Monitor to manage your diabetes. Get A1C down to 7% or less.   MONITORING & EVALUATION Dietary  intake, weekly physical activity, and blood sugars in 1 month. Recommend a CGM to use to help manage his DM. Next Steps  Patient is to work on meal prepping, meal planning and exercise.

## 2022-12-21 ENCOUNTER — Encounter: Payer: 59 | Attending: Family Medicine | Admitting: Nutrition

## 2022-12-21 VITALS — Ht 76.0 in | Wt 298.0 lb

## 2022-12-21 DIAGNOSIS — E781 Pure hyperglyceridemia: Secondary | ICD-10-CM | POA: Insufficient documentation

## 2022-12-21 DIAGNOSIS — E118 Type 2 diabetes mellitus with unspecified complications: Secondary | ICD-10-CM | POA: Diagnosis present

## 2022-12-21 DIAGNOSIS — E782 Mixed hyperlipidemia: Secondary | ICD-10-CM | POA: Insufficient documentation

## 2022-12-21 NOTE — Progress Notes (Addendum)
Medical Nutrition Therapy  Appointment Start time:  1530  Appointment End time:  1630  Primary concerns today: New DX Type 2 DM, Hyperlipidemia Referral diagnosis: E11.8, E78.0 Preferred learning style: No Preference  Learning readiness:  Change in progress.    NUTRITION ASSESSMENT  Dm Follow up Changes made: eating more fruits, vegetables and whole grains. Cut out sodas and junk food. Drinking water, working out twice a day and eating 3 better balanced meals. His BS is 100-130's in am and 130-140's in the pm. He is doing excellent.  He didn't take the Ozempic and is just taking Metformin 500 mg BID. He didn't get a CGM and doesn't need it now that he has changed his lifestyle choices.  He doesn't want to be on medications and desired to reverse his DM with lifestyle changes, which he is doing nicely. 7 day avg on glucometer checking blood sugars BID is 117 mg/dl. No symptoms of episodes of low blood sugars.  Sleeping better. Getting better workouts. Feels better. Going to see PCP soon.  Anthropometrics  Wt Readings from Last 3 Encounters:  12/04/22 (!) 313 lb (142 kg)  12/31/20 (!) 340 lb (154.2 kg)  10/12/20 (!) 330 lb (149.7 kg)   Ht Readings from Last 3 Encounters:  12/04/22 6\' 4"  (1.93 m)  12/31/20 6\' 4"  (1.93 m)  10/12/20 6\' 4"  (1.93 m)   There is no height or weight on file to calculate BMI. @BMIFA @ Facility age limit for growth %iles is 20 years. Facility age limit for growth %iles is 20 years.    Clinical Medical Hx: See chart Medications: Metformin 500 mg BID. Is not going to start the Ozempic for now. Labs:  []  eGFR mL/min/1.73 102       Notable Signs/Symptoms: Increased thirst, frequent urination, weight loss, chronic fatigue, blurry vision  Lifestyle & Dietary Hx Lives with his wife and kids. Out of work right now. Eats out often. Doesn't like to cook. Going to gym daily.  Estimated daily fluid intake: 100 oz Supplements: MVI Sleep: Fair Stress /  self-care: his new diagnosis of DM and being out of a job Current average weekly physical activity: going to gym daily now.  24-Hr Dietary Recall B)eggs, oatmeal, fruit L) left overs of vegetables, beans and fruit, water D)vegetables, fruits, whole grains, and beans, water water Estimated Energy Needs Calories: 2000 Carbohydrate: 225g Protein: 150g Fat: 56g   NUTRITION DIAGNOSIS  NB-1.1 Food and nutrition-related knowledge deficit As related to Diabetes Type 2 .  As evidenced by A1C 12%.   NUTRITION INTERVENTION  Nutrition education (E-1) on the following topics:  Nutrition and Diabetes education provided on My Plate, CHO counting, meal planning, portion sizes, timing of meals, avoiding snacks between meals unless having a low blood sugar, target ranges for A1C and blood sugars, signs/symptoms and treatment of hyper/hypoglycemia, monitoring blood sugars, taking medications as prescribed, benefits of exercising 30 minutes per day and prevention of complications of DM.  Lifestyle Medicine  - Whole Food, Plant Predominant Nutrition is highly recommended: Eat Plenty of vegetables, Mushrooms, fruits, Legumes, Whole Grains, Nuts, seeds in lieu of processed meats, processed snacks/pastries red meat, poultry, eggs.    -It is better to avoid simple carbohydrates including: Cakes, Sweet Desserts, Ice Cream, Soda (diet and regular), Sweet Tea, Candies, Chips, Cookies, Store Bought Juices, Alcohol in Excess of  1-2 drinks a day, Lemonade,  Artificial Sweeteners, Doughnuts, Coffee Creamers, "Sugar-free" Products, etc, etc.  This is not a complete list.....  Exercise: If  you are able: 30 -60 minutes a day ,4 days a week, or 150 minutes a week.  The longer the better.  Combine stretch, strength, and aerobic activities.  If you were told in the past that you have high risk for cardiovascular diseases, you may seek evaluation by your heart doctor prior to initiating moderate to intense exercise  programs.   Handouts Provided Include  Lifestyle Medicine Know your numbers   Learning Style & Readiness for Change Teaching method utilized: Visual & Auditory  Demonstrated degree of understanding via: Teach Back  Barriers to learning/adherence to lifestyle change: None  Goals Established by Pt  Keep up the fantastic job!! Test BS only 5-6 times per week, rotating times in am and bedtime. Increase dried beans for more fiber and protein. Eat 45-60 g of carbs per meal Keep drinking water; a gallon a day. Keep working out twice a day or minimum of 150 minutes per week. Get A1C 5.7% or below Talk to MD in 3-6 months to see if you can get off Metformin if A1C is 5.7% or below. Lose 10 lbs.   MONITORING & EVALUATION Dietary intake, weekly physical activity, and blood sugars in 3-4 month. Recommend a CGM to use to help manage his DM. Next Steps  Patient is to work on meal prepping, meal planning and exercise.

## 2022-12-21 NOTE — Patient Instructions (Addendum)
Goals  Keep up the fantastic job!! Test BS only 5-6 times per week, rotating times in am and bedtime. Increase dried beans for more fiber and protein. Eat 45-60 g of carbs per meal Keep drinking water; a gallon a day. Keep working out twice a day or minimum of 150 minutes per week. Get A1C 5.7% or below Talk to MD in 3-6 months to see if you can get off Metformin if A1C is 5.7% or below. Lose 10 lbs.

## 2022-12-25 ENCOUNTER — Encounter: Payer: Self-pay | Admitting: Nutrition

## 2023-03-16 ENCOUNTER — Encounter: Payer: Self-pay | Admitting: Gastroenterology

## 2023-03-20 ENCOUNTER — Encounter: Payer: Self-pay | Admitting: Internal Medicine

## 2023-04-09 ENCOUNTER — Ambulatory Visit: Payer: 59 | Admitting: Nutrition

## 2023-10-16 NOTE — Progress Notes (Unsigned)
 Referring Provider: Lauran Pollard, MD  Primary Care Physician:  Alston Jerry, MD Primary Gastroenterologist:  Dr. Nolene Baumgarten previously. Establishing with Dr. Mordechai April  No chief complaint on file.   HPI:   Frank Lucero is a 43 y.o. male presenting today at the request of Lauran Pollard, MD for fatty liver.  Reviewed office note included in referral dated/11/25.  Patient was being evaluated for dysuria, hematuria.  He was prescribed cefdinir for UTI.  Nothing mentioned about fatty liver.  Labs included in referral dated 10/01/2023: Hemoglobin 15.2, platelets 201, AST 13, ALT 13, alk phos 81, total bilirubin 2.4 (H), electrolytes and kidney function within normal limits. Hemoglobin A1c 4.7  No abdominal imaging included in referral.   Abdominal ultrasound on file from March 2011 with probable fatty liver.   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 Nolene Baumgarten: diverticulosis, redundant left colon, internal and external hemorrhoids   GIVENS CAPSULE STUDY  12/05/2010   [facility]: NL TI AND COLON/Small internal hemorrhoids   UPPER GASTROINTESTINAL ENDOSCOPY  FEB 2011 [place], [facility]    HH, CHRONIC GASTRITIS, DUODENITIS    Current Outpatient Medications  Medication Sig Dispense Refill   clonazePAM (KLONOPIN) 1 MG tablet Take 1 mg by mouth 3 (three) times daily.     FLUoxetine  (PROZAC ) 20 MG tablet Take 20 mg by mouth daily. (Patient not taking: Reported on 12/31/2020)     hydrocortisone  (ANUSOL -HC) 2.5 % rectal cream Place 1 application rectally 2 (two) times daily. For up to 10 days at a time. (Patient not taking: No sig reported) 30 g 2   imipramine  (TOFRANIL ) 10 MG tablet 3 PO QHS (Patient not taking: No sig reported) 90 tablet 11   omeprazole (PRILOSEC) 20 MG capsule Take 20 mg by mouth at bedtime.     trimethoprim -polymyxin b  (POLYTRIM ) ophthalmic solution Place 2 drops into the left eye every 6 (six) hours. (Patient not taking: No sig reported) 10 mL 0   No current facility-administered medications for this visit.    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  Tobacco Use   Smoking status: Former    Current packs/day: 0.00    Average packs/day: 1 pack/day for 18.0 years (18.0 ttl pk-yrs)    Types: Cigarettes    Start date: 11/11/1991    Quit date: 11/10/2009    Years since quitting: 13.9   Smokeless tobacco: Never  Substance and Sexual Activity   Alcohol use: No   Drug use: No   Sexual activity: Not on file  Other Topics Concern   Not on file  Social History Narrative   Not on file   Social Drivers of Health   Financial Resource Strain: Not on file  Food Insecurity:  Not on file  Transportation Needs: Not on file  Physical Activity: Not on file  Stress: Not on file  Social Connections: Not on file  Intimate Partner Violence: Not on file    Review of Systems: Gen: Denies any fever, chills, fatigue, weight loss, lack of appetite.  CV: Denies chest pain, heart palpitations, peripheral edema, syncope.  Resp: Denies shortness of breath at rest or with exertion. Denies wheezing or cough.  GI: Denies dysphagia or odynophagia. Denies jaundice, hematemesis, fecal incontinence. GU : Denies urinary burning, urinary frequency, urinary hesitancy MS: Denies joint pain, muscle weakness, cramps, or limitation of movement.  Derm: Denies rash, itching, dry skin Psych: Denies depression, anxiety, memory loss, and confusion Heme: Denies bruising, bleeding, and enlarged lymph nodes.  Physical Exam: There were no vitals taken for this visit. General:   Alert and oriented. Pleasant and cooperative. Well-nourished and well-developed.  Head:  Normocephalic and atraumatic. Eyes:  Without icterus, sclera clear and conjunctiva pink.  Ears:  Normal auditory acuity. Lungs:  Clear to auscultation bilaterally. No wheezes, rales, or rhonchi. No distress.  Heart:  S1, S2 present without murmurs appreciated.  Abdomen:  +BS, soft, non-tender and non-distended. No HSM noted. No guarding or rebound. No masses appreciated.  Rectal:  Deferred  Msk:  Symmetrical without gross deformities. Normal posture. Extremities:  Without edema. Neurologic:  Alert and  oriented x4;  grossly normal neurologically. Skin:  Intact without significant lesions or rashes. Psych:  Alert and cooperative. Normal mood and affect.    Assessment:     Plan:  ***   Frank Daring, PA-C [facility] Gastroenter[facility] 10/18/2023

## 2023-10-18 ENCOUNTER — Encounter: Payer: Self-pay | Admitting: Gastroenterology

## 2023-10-18 ENCOUNTER — Ambulatory Visit (INDEPENDENT_AMBULATORY_CARE_PROVIDER_SITE_OTHER): Admitting: Gastroenterology

## 2023-10-18 VITALS — BP 122/86 | HR 72 | Temp 97.8°F | Ht 76.0 in | Wt 246.0 lb

## 2023-10-18 DIAGNOSIS — K760 Fatty (change of) liver, not elsewhere classified: Secondary | ICD-10-CM

## 2023-10-18 DIAGNOSIS — R17 Unspecified jaundice: Secondary | ICD-10-CM

## 2023-10-18 DIAGNOSIS — R1013 Epigastric pain: Secondary | ICD-10-CM

## 2023-10-18 NOTE — Patient Instructions (Signed)
 Please have blood work completed at [facility].  Monitor for recurrent abdominal pain and let me know if you would like further work-up.   I would recommend you keep a log of if and when you abdominal pain occurs as well as what you ate prior to see if you can identify any specific triggers.  You can use [employer] as needed.  It was good to meet you today!  Shana Daring, PA-C [facility] Gastroenter[facility]

## 2023-10-19 ENCOUNTER — Ambulatory Visit: Payer: Self-pay | Admitting: Gastroenterology

## 2023-10-19 LAB — BILIRUBIN, FRACTIONATED(TOT/DIR/INDIR)
Bilirubin Total: 1 mg/dL (ref 0.0–1.2)
Bilirubin, Direct: 0.3 mg/dL (ref 0.00–0.40)
Bilirubin, Indirect: 0.7 mg/dL (ref 0.10–0.80)

## 2023-11-03 ENCOUNTER — Emergency Department (HOSPITAL_COMMUNITY)

## 2023-11-03 ENCOUNTER — Emergency Department (HOSPITAL_COMMUNITY)
Admission: EM | Admit: 2023-11-03 | Discharge: 2023-11-03 | Disposition: A | Discharge status: Home / Self Care | Attending: Emergency Medicine | Admitting: Emergency Medicine

## 2023-11-03 ENCOUNTER — Other Ambulatory Visit: Payer: Self-pay

## 2023-11-03 ENCOUNTER — Encounter (HOSPITAL_COMMUNITY): Payer: Self-pay

## 2023-11-03 DIAGNOSIS — K8050 Calculus of bile duct without cholangitis or cholecystitis without obstruction: Secondary | ICD-10-CM | POA: Insufficient documentation

## 2023-11-03 DIAGNOSIS — R1013 Epigastric pain: Secondary | ICD-10-CM | POA: Diagnosis present

## 2023-11-03 DIAGNOSIS — E119 Type 2 diabetes mellitus without complications: Secondary | ICD-10-CM | POA: Diagnosis not present

## 2023-11-03 LAB — COMPREHENSIVE METABOLIC PANEL WITH GFR
ALT: 14 U/L (ref 0–44)
AST: 18 U/L (ref 15–41)
Albumin: 4.6 g/dL (ref 3.5–5.0)
Alkaline Phosphatase: 68 U/L (ref 38–126)
Anion gap: 13 (ref 5–15)
BUN: 22 mg/dL — ABNORMAL HIGH (ref 6–20)
CO2: 22 mmol/L (ref 22–32)
Calcium: 9.7 mg/dL (ref 8.9–10.3)
Chloride: 102 mmol/L (ref 98–111)
Creatinine, Ser: 0.86 mg/dL (ref 0.61–1.24)
GFR, Estimated: >60 mL/min (ref >60)
Glucose, Bld: 121 mg/dL — ABNORMAL HIGH (ref 70–99)
Potassium: 4.1 mmol/L (ref 3.5–5.1)
Sodium: 137 mmol/L (ref 135–145)
Total Bilirubin: 1.4 mg/dL — ABNORMAL HIGH (ref 0.0–1.2)
Total Protein: 7.8 g/dL (ref 6.5–8.1)

## 2023-11-03 LAB — CBC WITH DIFFERENTIAL/PLATELET
Abs Immature Granulocytes: 0.02 10*3/uL (ref 0.00–0.07)
Basophils Absolute: 0.1 10*3/uL (ref 0.0–0.1)
Basophils Relative: 1 %
Eosinophils Absolute: 0.1 10*3/uL (ref 0.0–0.5)
Eosinophils Relative: 1 %
HCT: 44.2 % (ref 39.0–52.0)
Hemoglobin: 15.7 g/dL (ref 13.0–17.0)
Immature Granulocytes: 0 %
Lymphocytes Relative: 13 %
Lymphs Abs: 1.2 10*3/uL (ref 0.7–4.0)
MCH: 31.2 pg (ref 26.0–34.0)
MCHC: 35.5 g/dL (ref 30.0–36.0)
MCV: 87.7 fL (ref 80.0–100.0)
Monocytes Absolute: 0.5 10*3/uL (ref 0.1–1.0)
Monocytes Relative: 6 %
Neutro Abs: 7 10*3/uL (ref 1.7–7.7)
Neutrophils Relative %: 79 %
Platelets: 216 10*3/uL (ref 150–400)
RBC: 5.04 MIL/uL (ref 4.22–5.81)
RDW: 12.7 % (ref 11.5–15.5)
WBC: 8.9 10*3/uL (ref 4.0–10.5)
nRBC: 0 % (ref 0.0–0.2)

## 2023-11-03 LAB — LIPASE, BLOOD: Lipase: 37 U/L (ref 11–51)

## 2023-11-03 MED ORDER — SODIUM CHLORIDE 0.9 % IV SOLN
2.0000 g | Freq: Once | INTRAVENOUS | Status: AC
  Filled 2023-11-03: qty 20

## 2023-11-03 MED ORDER — PANTOPRAZOLE SODIUM 40 MG IV SOLR
40.0000 mg | Freq: Once | INTRAVENOUS | Status: AC
  Filled 2023-11-03: qty 10

## 2023-11-03 MED ORDER — ONDANSETRON HCL 4 MG/2ML IJ SOLN
4.0000 mg | Freq: Once | INTRAMUSCULAR | Status: AC
  Filled 2023-11-03: qty 2

## 2023-11-03 MED ORDER — IOHEXOL 300 MG/ML  SOLN: 100.0000 mL | Freq: Once | INTRAMUSCULAR | Status: AC | PRN

## 2023-11-03 MED ORDER — MORPHINE SULFATE (PF) 4 MG/ML IV SOLN
4.0000 mg | Freq: Once | INTRAVENOUS | Status: AC
  Filled 2023-11-03: qty 1

## 2023-11-03 MED ORDER — ALUM & MAG HYDROXIDE-SIMETH 200-200-20 MG/5ML PO SUSP
30.0000 mL | Freq: Once | ORAL | Status: AC
  Filled 2023-11-03: qty 30

## 2023-11-03 MED ORDER — ONDANSETRON HCL 4 MG/2ML IJ SOLN
4.0000 mg | Freq: Once | INTRAMUSCULAR | Status: DC
  Filled 2023-11-03: qty 2

## 2023-11-03 MED ORDER — DICYCLOMINE HCL 10 MG PO CAPS
10.0000 mg | ORAL_CAPSULE | Freq: Once | ORAL | Status: AC
  Administered 2023-11-03: 10 mg via ORAL
  Filled 2023-11-03: qty 1

## 2023-11-03 NOTE — ED Notes (Addendum)
 Ultrasound & CT complete.

## 2023-11-03 NOTE — ED Provider Notes (Signed)
 I have talked to this patient at the bedside, he reports that he has had about approximately 100 pounds of weight loss over the last year, with this rapid weight loss came on some intermittent episodes of abdominal pain which occur around midnight usually after he eats later at night, last night he had pizza at 9:00 and around midnight started having pain which lasted for several hours.  The pain is currently completely gone and on my exam he has no abdominal tenderness.  I have reviewed with the patient that his labs are normal, his ultrasound is equivocal but suggest that there is no gallbladder pathology.  He is willing to follow-up in the outpatient setting.  Again he is completely nontender and asymptomatic with normal vital signs at the time of discharge and he understands indications for return.  He was given Dr. Collene Dawson phone number for follow-up   Early Glisson, MD 11/03/23 1101

## 2023-11-03 NOTE — ED Notes (Signed)
 [place] at bedside

## 2023-11-03 NOTE — ED Provider Notes (Signed)
 [facility] AT [facility] Provider Note   CSN: [id number] Arrival date & time: 11/03/23  2952     Patient presents with: Abdominal Pain   Frank Lucero is a 43 y.o. male.  {Add pertinent medical, surgical, social history, OB history to HPI:32947} HPI     This is a 43 year old male who presents with abdominal pain.  Patient reports he has had abdominal pain for the last 4 hours.  It is mostly in the epigastrium.  He has had 2 discrete episodes in the last month of similar pain.  He states that the other 2 episodes self resolved.  He has had nausea without vomiting.  He reports normal bowel movements.  He states that he felt like he was going to pass out secondary to the pain.  He states that normally he is on a strict diet but tonight did eat pizza.  He has seen gastroenterology but they are not sure what is going on.  He took some Pepto-Bismol with minimal relief.  Not currently on a PPI or any other medications.  Prior to Admission medications   Medication Sig Start Date End Date Taking? Authorizing Provider  Multiple Vitamins-Minerals (ONE A DAY MENS VITACRAVES PO) Take by mouth.    [provider]    Allergies: Niacin and related    Review of Systems  Constitutional:  Negative for fever.  Respiratory:  Negative for shortness of breath.   Cardiovascular:  Negative for chest pain.  Gastrointestinal:  Positive for abdominal pain and nausea. Negative for diarrhea and vomiting.  All other systems reviewed and are negative.   Updated Vital Signs BP 122/77   Pulse (!) 57   Temp 97.6 F (36.4 C) (Oral)   Resp (!) 22   Ht 1.93 m (6' 4)   Wt 111.6 kg   SpO2 96%   BMI 29.94 kg/m   Physical Exam Vitals and nursing note reviewed.  Constitutional:      Appearance: He is well-developed. He is obese. He is not ill-appearing.  HENT:     Head: Normocephalic and atraumatic.   Eyes:     Pupils: Pupils are equal, round, and reactive  to light.    Cardiovascular:     Rate and Rhythm: Normal rate and regular rhythm.     Heart sounds: Normal heart sounds. No murmur heard. Pulmonary:     Effort: Pulmonary effort is normal. No respiratory distress.     Breath sounds: Normal breath sounds. No wheezing.  Abdominal:     General: Bowel sounds are normal.     Palpations: Abdomen is soft.     Tenderness: There is abdominal tenderness in the epigastric area. There is no guarding or rebound.   Musculoskeletal:     Cervical back: Neck supple.  Lymphadenopathy:     Cervical: No cervical adenopathy.   Skin:    General: Skin is warm and dry.   Neurological:     Mental Status: He is alert and oriented to person, place, and time.   Psychiatric:        Mood and Affect: Mood normal.     (all labs ordered are listed, but only abnormal results are displayed) Labs Reviewed  COMPREHENSIVE METABOLIC PANEL WITH GFR - Abnormal; Notable for the following components:      Result Value   Glucose, Bld 121 (*)    BUN 22 (*)    Total Bilirubin 1.4 (*)    All other components within normal  limits  CBC WITH DIFFERENTIAL/PLATELET  LIPASE, BLOOD    EKG: None  Radiology: No results found.  {Document cardiac monitor, telemetry assessment procedure when appropriate:32947} Procedures   Medications Ordered in the ED  ondansetron [facility]) injection 4 mg (0 mg Intravenous Hold 11/03/23 0548)  ondansetron (ZOFRAN) injection 4 mg (4 mg Intravenous Given 11/03/23 0437)  dicyclomine (BENTYL) capsule 10 mg (10 mg Oral Given 11/03/23 0436)  alum & mag hydroxide-simeth (MAALOX/MYLANTA) 200-200-20 MG/5ML suspension 30 mL (30 mLs Oral Given 11/03/23 0436)  pantoprazole  (PROTONIX ) injection 40 mg (40 mg Intravenous Given 11/03/23 0438)  morphine (PF) 4 MG/ML injection 4 mg (4 mg Intravenous Given 11/03/23 0543)  iohexol (OMNIPAQUE) 300 MG/ML solution 100 mL (100 mLs Intravenous Contrast Given 11/03/23 0552)      {Click here for ABCD2, HEART and  other calculators REFRESH Note before signing:1}                              Medical Decision Making Amount and/or Complexity of Data Reviewed Labs: ordered. Radiology: ordered.  Risk OTC drugs. Prescription drug management.   ***  {Document critical care time when appropriate  Document review of labs and clinical decision tools ie CHADS2VASC2, etc  Document your independent review of radiology images and any outside records  Document your discussion with family members, caretakers and with consultants  Document social determinants of health affecting pt's care  Document your decision making why or why not admission, treatments were needed:32947:::1}   Final diagnoses:  None    ED Discharge Orders     None

## 2023-11-03 NOTE — ED Triage Notes (Signed)
 Pov from home cc of center abdominal pain for around 4 hours. Nausea from the pain. Sees GI but they dont know what is wrong with him.  Also c/o weakness, lightheadedness Wife states he's fallen a few times too 9/10  Ate pizza for dinner, says he isnt supposed to.

## 2023-11-03 NOTE — Discharge Instructions (Addendum)
 return to the ER immediately for severe or worsening symptoms that last longer than several hours.  You will need to follow-up with the general surgeon Dr. Collene Dawson this week, within the neck 7 days.  Please see the phone number above and call on Monday morning to make an appointment.

## 2023-11-08 ENCOUNTER — Telehealth: Payer: Self-pay

## 2023-11-08 NOTE — Telephone Encounter (Signed)
 Pt's wife called needing to advise of the pt's visit to ED  and what to next

## 2023-11-09 ENCOUNTER — Encounter: Payer: Self-pay | Admitting: *Deleted

## 2023-11-14 NOTE — Telephone Encounter (Signed)
LMOM for pt to call office. Also sent a MyChart message.

## 2023-11-14 NOTE — Telephone Encounter (Signed)
 Reviewed ER evaluation. Looks like concern for biliary colic/gallbladder disease. Labs look good. Recommend keeping upcoming appointment with general surgery on 7/3 to discuss having his gallbladder removed. Follow a bland diet for now.

## 2023-11-18 ENCOUNTER — Emergency Department (HOSPITAL_COMMUNITY)
Admission: EM | Admit: 2023-11-18 | Discharge: 2023-11-18 | Disposition: A | Discharge status: Home / Self Care | Attending: Emergency Medicine | Admitting: Emergency Medicine

## 2023-11-18 ENCOUNTER — Other Ambulatory Visit: Payer: Self-pay

## 2023-11-18 DIAGNOSIS — R1011 Right upper quadrant pain: Secondary | ICD-10-CM | POA: Diagnosis present

## 2023-11-18 DIAGNOSIS — K8050 Calculus of bile duct without cholangitis or cholecystitis without obstruction: Secondary | ICD-10-CM | POA: Insufficient documentation

## 2023-11-18 LAB — CBC WITH DIFFERENTIAL/PLATELET
Abs Immature Granulocytes: 0.03 10*3/uL (ref 0.00–0.07)
Basophils Absolute: 0.1 10*3/uL (ref 0.0–0.1)
Basophils Relative: 1 %
Eosinophils Absolute: 0.3 10*3/uL (ref 0.0–0.5)
Eosinophils Relative: 3 %
HCT: 48.1 % (ref 39.0–52.0)
Hemoglobin: 16.2 g/dL (ref 13.0–17.0)
Immature Granulocytes: 0 %
Lymphocytes Relative: 29 %
Lymphs Abs: 2.7 10*3/uL (ref 0.7–4.0)
MCH: 29.5 pg (ref 26.0–34.0)
MCHC: 33.7 g/dL (ref 30.0–36.0)
MCV: 87.5 fL (ref 80.0–100.0)
Monocytes Absolute: 0.8 10*3/uL (ref 0.1–1.0)
Monocytes Relative: 8 %
Neutro Abs: 5.5 10*3/uL (ref 1.7–7.7)
Neutrophils Relative %: 59 %
Platelets: 233 10*3/uL (ref 150–400)
RBC: 5.5 MIL/uL (ref 4.22–5.81)
RDW: 12.3 % (ref 11.5–15.5)
WBC: 9.3 10*3/uL (ref 4.0–10.5)
nRBC: 0 % (ref 0.0–0.2)

## 2023-11-18 LAB — COMPREHENSIVE METABOLIC PANEL WITH GFR
ALT: 18 U/L (ref 0–44)
AST: 25 U/L (ref 15–41)
Albumin: 4.5 g/dL (ref 3.5–5.0)
Alkaline Phosphatase: 88 U/L (ref 38–126)
Anion gap: 18 — ABNORMAL HIGH (ref 5–15)
BUN: 15 mg/dL (ref 6–20)
CO2: 21 mmol/L — ABNORMAL LOW (ref 22–32)
Calcium: 9.7 mg/dL (ref 8.9–10.3)
Chloride: 100 mmol/L (ref 98–111)
Creatinine, Ser: 0.93 mg/dL (ref 0.61–1.24)
GFR, Estimated: >60 mL/min (ref >60)
Glucose, Bld: 119 mg/dL — ABNORMAL HIGH (ref 70–99)
Potassium: 4 mmol/L (ref 3.5–5.1)
Sodium: 139 mmol/L (ref 135–145)
Total Bilirubin: 2.4 mg/dL — ABNORMAL HIGH (ref 0.0–1.2)
Total Protein: 7.6 g/dL (ref 6.5–8.1)

## 2023-11-18 LAB — LIPASE, BLOOD: Lipase: 32 U/L (ref 11–51)

## 2023-11-18 MED ORDER — DICYCLOMINE HCL 10 MG/ML IM SOLN
20.0000 mg | Freq: Once | INTRAMUSCULAR | Status: AC
  Administered 2023-11-18: 20 mg via INTRAMUSCULAR
  Filled 2023-11-18: qty 2

## 2023-11-18 MED ORDER — ALUM & MAG HYDROXIDE-SIMETH 200-200-20 MG/5ML PO SUSP
15.0000 mL | Freq: Once | ORAL | Status: AC
  Administered 2023-11-18: 15 mL via ORAL
  Filled 2023-11-18: qty 30

## 2023-11-18 MED ORDER — KETOROLAC TROMETHAMINE 15 MG/ML IJ SOLN
30.0000 mg | Freq: Once | INTRAMUSCULAR | Status: AC
  Administered 2023-11-18: 30 mg via INTRAVENOUS
  Filled 2023-11-18: qty 2

## 2023-11-18 MED ORDER — MORPHINE SULFATE (PF) 4 MG/ML IV SOLN
4.0000 mg | Freq: Once | INTRAVENOUS | Status: AC
  Administered 2023-11-18: 4 mg via INTRAVENOUS
  Filled 2023-11-18: qty 1

## 2023-11-18 MED ORDER — KETOROLAC TROMETHAMINE 30 MG/ML IJ SOLN: 30.0000 mg | Freq: Once | INTRAMUSCULAR | 0 refills | Status: DC

## 2023-11-18 MED ORDER — ONDANSETRON HCL 4 MG/2ML IJ SOLN
4.0000 mg | Freq: Once | INTRAMUSCULAR | Status: AC
Start: 2023-11-18 — End: 2023-11-18
  Administered 2023-11-18: 4 mg via INTRAVENOUS
  Filled 2023-11-18: qty 2

## 2023-11-18 MED ORDER — FENTANYL CITRATE (PF) 100 MCG/2ML IJ SOLN
100.0000 ug | Freq: Once | INTRAMUSCULAR | Status: AC
  Administered 2023-11-18: 100 ug via INTRAVENOUS
  Filled 2023-11-18: qty 2

## 2023-11-18 MED ORDER — SIMETHICONE 80 MG PO CHEW
80.0000 mg | CHEWABLE_TABLET | Freq: Once | ORAL | Status: DC
  Filled 2023-11-18: qty 1

## 2023-11-18 MED ORDER — PANTOPRAZOLE SODIUM 40 MG IV SOLR
40.0000 mg | Freq: Once | INTRAVENOUS | Status: AC
  Administered 2023-11-18: 40 mg via INTRAVENOUS
  Filled 2023-11-18: qty 10

## 2023-11-18 NOTE — Discharge Instructions (Addendum)
 Today you were seen for biliary colic.  Please try to maintain a low-fat clear liquid diet until you follow-up with gastroenterology in 3 days.  Please return to the ED if you have several hours of worsening pain or uncontrollable vomiting. thank you for letting us  treat you today. After reviewing your labs, I feel you are safe to go home. Please follow up with your PCP in the next several days and provide them with your records from this visit. Return to the Emergency Room if pain becomes severe or symptoms worsen.

## 2023-11-18 NOTE — ED Triage Notes (Signed)
 Pt c/o abd pain mid upper and right flank pain. Pain started about 30 mins to 1 hr ago. Pt previously told he may need gallbladder surgery.

## 2023-11-18 NOTE — ED Provider Notes (Signed)
 [place] EMERGENCY DEPARTMENT AT [facility] Provider Note   CSN: [id number] Arrival date & time: 11/18/23  1639     Patient presents with: Abdominal Pain   Frank Lucero is a 43 y.o. male presents today for right upper quadrant abdominal pain.  Patient was seen on 6/14 and found to have biliary colic.  Patient is to have an appointment with gastroenterology on 7/3 for evaluation for cholecystectomy. Patient endorses nausea and retching.   HPI     Prior to Admission medications   Medication Sig Start Date End Date Taking? Authorizing Provider  Multiple Vitamins-Minerals (ONE A DAY MENS VITACRAVES PO) Take by mouth.    Provider, Historical, Frank Lucero    Allergies: Niacin and related    Review of Systems  Gastrointestinal:  Positive for abdominal pain, nausea and vomiting.    Updated Vital Signs BP 111/66   Pulse 67   Temp (!) 97.4 F (36.3 C) (Oral)   Resp 16   Ht 6' 4 (1.93 m)   Wt 112 kg   SpO2 100%   BMI 30.06 kg/m   Physical Exam Vitals and nursing note reviewed.  Constitutional:      General: He is not in acute distress.    Appearance: Normal appearance. He is well-developed.     Comments: Uncomfortable appearing and retching on exam  HENT:     Head: Normocephalic and atraumatic.     Nose: Nose normal.     Mouth/Throat:     Mouth: Mucous membranes are moist.     Pharynx: Oropharynx is clear.   Eyes:     Conjunctiva/sclera: Conjunctivae normal.    Cardiovascular:     Rate and Rhythm: Normal rate and regular rhythm.     Pulses: Normal pulses.     Heart sounds: No murmur heard. Pulmonary:     Effort: Pulmonary effort is normal. No respiratory distress.     Breath sounds: Normal breath sounds.  Abdominal:     Palpations: Abdomen is soft.     Tenderness: There is abdominal tenderness in the right upper quadrant. Positive signs include Murphy's sign.   Musculoskeletal:        General: No swelling.     Cervical back: Neck supple.    Skin:    General: Skin is warm and dry.     Capillary Refill: Capillary refill takes less than 2 seconds.   Neurological:     General: No focal deficit present.     Mental Status: He is alert.   Psychiatric:        Mood and Affect: Mood normal.     (all labs ordered are listed, but only abnormal results are displayed) Labs Reviewed  COMPREHENSIVE METABOLIC PANEL WITH GFR - Abnormal; Notable for the following components:      Result Value   CO2 21 (*)    Glucose, Bld 119 (*)    Total Bilirubin 2.4 (*)    Anion gap 18 (*)    All other components within normal limits  LIPASE, BLOOD  CBC WITH DIFFERENTIAL/PLATELET    EKG: None  Radiology: No results found.   Procedures   Medications Ordered in the ED  simethicone  [facility]) chewable tablet 80 mg (80 mg Oral Not Given 11/18/23 2141)  morphine  (PF) 4 MG/ML injection 4 mg (4 mg Intravenous Given 11/18/23 1719)  ondansetron  (ZOFRAN ) injection 4 mg (4 mg Intravenous Given 11/18/23 1718)  alum & mag hydroxide-simeth (MAALOX/MYLANTA) 200-200-20 MG/5ML suspension 15 mL (15 mLs Oral  Given 11/18/23 1717)  pantoprazole  (PROTONIX ) injection 40 mg (40 mg Intravenous Given 11/18/23 1741)  dicyclomine  (BENTYL ) injection 20 mg (20 mg Intramuscular Given 11/18/23 1743)  morphine  (PF) 4 MG/ML injection 4 mg (4 mg Intravenous Given 11/18/23 1837)  ketorolac  (TORADOL ) 15 MG/ML injection 30 mg (30 mg Intravenous Given 11/18/23 1911)  fentaNYL (SUBLIMAZE) injection 100 mcg (100 mcg Intravenous Given 11/18/23 2026)                                    Medical Decision Making Amount and/or Complexity of Data Reviewed Labs: ordered.  Risk OTC drugs. Prescription drug management.   This patient presents to the ED for concern of abdominal pain, this involves an extensive number of treatment options, and is a complaint that carries with it a high risk of complications and morbidity.  The differential diagnosis includes biliary colic, acute  cholecystitis, choledocholithiasis, pancreatitis, viral GI illness   Additional history obtained:  Additional history obtained from EMR External records from outside source obtained and reviewed including gastroentero allergy notes   Lab Tests:  I Ordered, and personally interpreted labs.  The pertinent results include: CBC WNL, CMP with anion gap of 18, total bilirubin of 2.4   Problem List / ED Course / Critical interventions / Medication management I ordered medication including GI cocktail, morphine , bentyl , fentanyl and Zofran  Reevaluation of the patient after these medicines showed that the patient improved and tolerated p.o. intake I have reviewed the patients home medicines and have made adjustments as needed   Test / Admission - Considered:  Considered for admission or further workup however patient's vital signs, physical exam, and labs have been reassuring.  Patient is feeling much better after medications and able to tolerate p.o. intake without issue.  Patient advised to do a clear liquid diet until he follows up with gastroenterology in the upcoming days.  Patient given return precautions.  I feel patient safe for discharge at this time.     Final diagnoses:  Biliary colic    ED Discharge Orders          Ordered    ketorolac  (TORADOL ) 30 MG/ML injection   Once,   Status:  Discontinued        11/18/23 1857               Frank Lucero, Frank Lucero 11/18/23 2316    Frank Lucero, Frank Lucero 11/19/23 [phone]

## 2023-11-18 NOTE — ED Notes (Signed)
 This nurse went to reposition pulse ox on pt's ear. Pt asked for pain med. Primary nurse notified.

## 2023-11-18 NOTE — ED Notes (Signed)
 Pt/family received d/c paperwork at this time. After going over the paperwork any questions, comments, or concerns were answered to the best of this nurse's knowledge. The pt/family verbally acknowledged the teachings/instructions.

## 2023-11-19 ENCOUNTER — Encounter: Payer: Self-pay | Admitting: Intensive Care

## 2023-11-19 ENCOUNTER — Emergency Department (HOSPITAL_COMMUNITY)

## 2023-11-19 ENCOUNTER — Other Ambulatory Visit: Payer: Self-pay

## 2023-11-19 ENCOUNTER — Inpatient Hospital Stay
Admission: EM | Admit: 2023-11-19 | Discharge: 2023-11-23 | DRG: 419 | Disposition: A | Discharge status: Home / Self Care | Attending: Internal Medicine | Admitting: Internal Medicine

## 2023-11-19 ENCOUNTER — Emergency Department (HOSPITAL_COMMUNITY)
Admission: EM | Admit: 2023-11-19 | Discharge: 2023-11-19 | Disposition: A | Discharge status: Hospice | Attending: Emergency Medicine | Admitting: Emergency Medicine

## 2023-11-19 ENCOUNTER — Encounter (HOSPITAL_COMMUNITY): Payer: Self-pay

## 2023-11-19 DIAGNOSIS — Z87891 Personal history of nicotine dependence: Secondary | ICD-10-CM

## 2023-11-19 DIAGNOSIS — K2950 Unspecified chronic gastritis without bleeding: Secondary | ICD-10-CM | POA: Diagnosis present

## 2023-11-19 DIAGNOSIS — I959 Hypotension, unspecified: Secondary | ICD-10-CM | POA: Diagnosis not present

## 2023-11-19 DIAGNOSIS — K8040 Calculus of bile duct with cholecystitis, unspecified, without obstruction: Secondary | ICD-10-CM | POA: Diagnosis not present

## 2023-11-19 DIAGNOSIS — K8001 Calculus of gallbladder with acute cholecystitis with obstruction: Secondary | ICD-10-CM

## 2023-11-19 DIAGNOSIS — F4323 Adjustment disorder with mixed anxiety and depressed mood: Secondary | ICD-10-CM | POA: Diagnosis present

## 2023-11-19 DIAGNOSIS — E1165 Type 2 diabetes mellitus with hyperglycemia: Secondary | ICD-10-CM | POA: Diagnosis present

## 2023-11-19 DIAGNOSIS — Z888 Allergy status to other drugs, medicaments and biological substances status: Secondary | ICD-10-CM

## 2023-11-19 DIAGNOSIS — R1011 Right upper quadrant pain: Secondary | ICD-10-CM | POA: Diagnosis present

## 2023-11-19 DIAGNOSIS — R748 Abnormal levels of other serum enzymes: Secondary | ICD-10-CM

## 2023-11-19 DIAGNOSIS — K8000 Calculus of gallbladder with acute cholecystitis without obstruction: Secondary | ICD-10-CM | POA: Diagnosis present

## 2023-11-19 DIAGNOSIS — E785 Hyperlipidemia, unspecified: Secondary | ICD-10-CM | POA: Diagnosis present

## 2023-11-19 DIAGNOSIS — K7581 Nonalcoholic steatohepatitis (NASH): Secondary | ICD-10-CM | POA: Diagnosis present

## 2023-11-19 DIAGNOSIS — R1013 Epigastric pain: Secondary | ICD-10-CM | POA: Diagnosis present

## 2023-11-19 DIAGNOSIS — Z743 Need for continuous supervision: Secondary | ICD-10-CM | POA: Diagnosis not present

## 2023-11-19 DIAGNOSIS — Z8619 Personal history of other infectious and parasitic diseases: Secondary | ICD-10-CM

## 2023-11-19 DIAGNOSIS — K838 Other specified diseases of biliary tract: Secondary | ICD-10-CM | POA: Diagnosis not present

## 2023-11-19 DIAGNOSIS — K8050 Calculus of bile duct without cholangitis or cholecystitis without obstruction: Secondary | ICD-10-CM | POA: Diagnosis present

## 2023-11-19 DIAGNOSIS — K219 Gastro-esophageal reflux disease without esophagitis: Secondary | ICD-10-CM | POA: Diagnosis present

## 2023-11-19 DIAGNOSIS — K8063 Calculus of gallbladder and bile duct with acute cholecystitis with obstruction: Principal | ICD-10-CM | POA: Diagnosis present

## 2023-11-19 DIAGNOSIS — E669 Obesity, unspecified: Secondary | ICD-10-CM | POA: Diagnosis present

## 2023-11-19 DIAGNOSIS — R1084 Generalized abdominal pain: Secondary | ICD-10-CM | POA: Diagnosis not present

## 2023-11-19 DIAGNOSIS — F419 Anxiety disorder, unspecified: Secondary | ICD-10-CM | POA: Diagnosis present

## 2023-11-19 DIAGNOSIS — R112 Nausea with vomiting, unspecified: Secondary | ICD-10-CM | POA: Diagnosis not present

## 2023-11-19 DIAGNOSIS — K8062 Calculus of gallbladder and bile duct with acute cholecystitis without obstruction: Secondary | ICD-10-CM | POA: Diagnosis not present

## 2023-11-19 DIAGNOSIS — K810 Acute cholecystitis: Secondary | ICD-10-CM

## 2023-11-19 DIAGNOSIS — R7989 Other specified abnormal findings of blood chemistry: Secondary | ICD-10-CM | POA: Diagnosis not present

## 2023-11-19 LAB — CBC WITH DIFFERENTIAL/PLATELET
Abs Immature Granulocytes: 0.03 10*3/uL (ref 0.00–0.07)
Abs Immature Granulocytes: 0.04 10*3/uL (ref 0.00–0.07)
Basophils Absolute: 0.1 10*3/uL (ref 0.0–0.1)
Basophils Absolute: 0 10*3/uL (ref 0.0–0.1)
Basophils Relative: 1 %
Basophils Relative: 0 %
Eosinophils Absolute: 0.1 10*3/uL (ref 0.0–0.5)
Eosinophils Absolute: 0 10*3/uL (ref 0.0–0.5)
Eosinophils Relative: 1 %
Eosinophils Relative: 0 %
HCT: 46.6 % (ref 39.0–52.0)
HCT: 43.8 % (ref 39.0–52.0)
Hemoglobin: 16.1 g/dL (ref 13.0–17.0)
Hemoglobin: 14.9 g/dL (ref 13.0–17.0)
Immature Granulocytes: 0 %
Immature Granulocytes: 0 %
Lymphocytes Relative: 9 %
Lymphocytes Relative: 7 %
Lymphs Abs: 1 10*3/uL (ref 0.7–4.0)
Lymphs Abs: 0.7 10*3/uL (ref 0.7–4.0)
MCH: 30.1 pg (ref 26.0–34.0)
MCH: 30 pg (ref 26.0–34.0)
MCHC: 34.5 g/dL (ref 30.0–36.0)
MCHC: 34 g/dL (ref 30.0–36.0)
MCV: 87.1 fL (ref 80.0–100.0)
MCV: 88.1 fL (ref 80.0–100.0)
Monocytes Absolute: 0.9 10*3/uL (ref 0.1–1.0)
Monocytes Absolute: 0.8 10*3/uL (ref 0.1–1.0)
Monocytes Relative: 8 %
Monocytes Relative: 9 %
Neutro Abs: 9 10*3/uL — ABNORMAL HIGH (ref 1.7–7.7)
Neutro Abs: 7.5 10*3/uL (ref 1.7–7.7)
Neutrophils Relative %: 81 %
Neutrophils Relative %: 84 %
Platelets: 239 10*3/uL (ref 150–400)
Platelets: 213 10*3/uL (ref 150–400)
RBC: 5.35 MIL/uL (ref 4.22–5.81)
RBC: 4.97 MIL/uL (ref 4.22–5.81)
RDW: 12.6 % (ref 11.5–15.5)
RDW: 12.6 % (ref 11.5–15.5)
WBC: 11.1 10*3/uL — ABNORMAL HIGH (ref 4.0–10.5)
WBC: 9.1 10*3/uL (ref 4.0–10.5)
nRBC: 0 % (ref 0.0–0.2)
nRBC: 0 % (ref 0.0–0.2)

## 2023-11-19 LAB — COMPREHENSIVE METABOLIC PANEL WITH GFR
ALT: 553 U/L — ABNORMAL HIGH (ref 0–44)
AST: 525 U/L — ABNORMAL HIGH (ref 15–41)
Albumin: 4 g/dL (ref 3.5–5.0)
Alkaline Phosphatase: 234 U/L — ABNORMAL HIGH (ref 38–126)
Anion gap: 11 (ref 5–15)
BUN: 14 mg/dL (ref 6–20)
CO2: 23 mmol/L (ref 22–32)
Calcium: 9.2 mg/dL (ref 8.9–10.3)
Chloride: 105 mmol/L (ref 98–111)
Creatinine, Ser: 0.86 mg/dL (ref 0.61–1.24)
GFR, Estimated: >60 mL/min (ref >60)
Glucose, Bld: 110 mg/dL — ABNORMAL HIGH (ref 70–99)
Potassium: 4.6 mmol/L (ref 3.5–5.1)
Sodium: 139 mmol/L (ref 135–145)
Total Bilirubin: 7.4 mg/dL — ABNORMAL HIGH (ref 0.0–1.2)
Total Protein: 6.8 g/dL (ref 6.5–8.1)

## 2023-11-19 LAB — BILIRUBIN, DIRECT: Bilirubin, Direct: 4.4 mg/dL — ABNORMAL HIGH (ref 0.0–0.2)

## 2023-11-19 LAB — GLUCOSE, CAPILLARY
Glucose-Capillary: 122 mg/dL — ABNORMAL HIGH (ref 70–99)
Glucose-Capillary: 107 mg/dL — ABNORMAL HIGH (ref 70–99)

## 2023-11-19 LAB — CBC
HCT: 45.4 % (ref 39.0–52.0)
Hemoglobin: 15.4 g/dL (ref 13.0–17.0)
MCH: 30 pg (ref 26.0–34.0)
MCHC: 33.9 g/dL (ref 30.0–36.0)
MCV: 88.5 fL (ref 80.0–100.0)
Platelets: 196 10*3/uL (ref 150–400)
RBC: 5.13 MIL/uL (ref 4.22–5.81)
RDW: 12.5 % (ref 11.5–15.5)
WBC: 8.7 10*3/uL (ref 4.0–10.5)
nRBC: 0 % (ref 0.0–0.2)

## 2023-11-19 LAB — LIPASE, BLOOD: Lipase: 32 U/L (ref 11–51)

## 2023-11-19 LAB — CREATININE, SERUM
Creatinine, Ser: 0.9 mg/dL (ref 0.61–1.24)
GFR, Estimated: >60 mL/min (ref >60)

## 2023-11-19 MED ORDER — PIPERACILLIN-TAZOBACTAM 3.375 G IVPB
3.3750 g | Freq: Three times a day (TID) | INTRAVENOUS | Status: DC
  Administered 2023-11-19 – 2023-11-21 (×5): 3.375 g via INTRAVENOUS
  Filled 2023-11-19 (×6): qty 50

## 2023-11-19 MED ORDER — INSULIN ASPART 100 UNIT/ML IJ SOLN: 0.0000 [IU] | Freq: Every day | INTRAMUSCULAR | Status: DC

## 2023-11-19 MED ORDER — INSULIN ASPART 100 UNIT/ML IJ SOLN
0.0000 [IU] | Freq: Three times a day (TID) | INTRAMUSCULAR | Status: DC
  Filled 2023-11-19: qty 1

## 2023-11-19 MED ORDER — HYDROMORPHONE HCL 1 MG/ML IJ SOLN
1.0000 mg | Freq: Once | INTRAMUSCULAR | Status: AC
  Administered 2023-11-19: 1 mg via INTRAVENOUS
  Filled 2023-11-19: qty 1

## 2023-11-19 MED ORDER — PIPERACILLIN-TAZOBACTAM 3.375 G IVPB 30 MIN
3.3750 g | Freq: Once | INTRAVENOUS | Status: DC
  Filled 2023-11-19: qty 50

## 2023-11-19 MED ORDER — HYDROMORPHONE HCL 1 MG/ML IJ SOLN
0.5000 mg | INTRAMUSCULAR | Status: DC | PRN
  Administered 2023-11-21 – 2023-11-22 (×4): 0.5 mg via INTRAVENOUS
  Filled 2023-11-19 (×4): qty 0.5

## 2023-11-19 MED ORDER — GADOBUTROL 1 MMOL/ML IV SOLN
10.0000 mL | Freq: Once | INTRAVENOUS | Status: AC | PRN
  Administered 2023-11-19: 10 mL via INTRAVENOUS

## 2023-11-19 MED ORDER — ENOXAPARIN SODIUM 40 MG/0.4ML IJ SOSY
40.0000 mg | PREFILLED_SYRINGE | INTRAMUSCULAR | Status: DC
  Administered 2023-11-19 – 2023-11-22 (×4): 40 mg via SUBCUTANEOUS
  Filled 2023-11-19 (×4): qty 0.4

## 2023-11-19 MED ORDER — DICYCLOMINE HCL 10 MG/ML IM SOLN
20.0000 mg | Freq: Once | INTRAMUSCULAR | Status: AC
  Administered 2023-11-19: 20 mg via INTRAMUSCULAR
  Filled 2023-11-19: qty 2

## 2023-11-19 MED ORDER — KETOROLAC TROMETHAMINE 15 MG/ML IJ SOLN
15.0000 mg | Freq: Once | INTRAMUSCULAR | Status: AC
  Administered 2023-11-19: 15 mg via INTRAVENOUS
  Filled 2023-11-19: qty 1

## 2023-11-19 MED ORDER — SODIUM CHLORIDE 0.9 % IV BOLUS
1000.0000 mL | Freq: Once | INTRAVENOUS | Status: AC
  Administered 2023-11-19: 1000 mL via INTRAVENOUS

## 2023-11-19 MED ORDER — OXYCODONE HCL 5 MG PO TABS
5.0000 mg | ORAL_TABLET | ORAL | Status: DC | PRN
  Administered 2023-11-19 – 2023-11-23 (×7): 5 mg via ORAL
  Filled 2023-11-19 (×8): qty 1

## 2023-11-19 MED ORDER — PIPERACILLIN-TAZOBACTAM 3.375 G IVPB 30 MIN
3.3750 g | Freq: Once | INTRAVENOUS | Status: AC
  Administered 2023-11-19: 3.375 g via INTRAVENOUS
  Filled 2023-11-19: qty 50

## 2023-11-19 MED ORDER — ONDANSETRON HCL 4 MG/2ML IJ SOLN
4.0000 mg | Freq: Once | INTRAMUSCULAR | Status: AC
  Administered 2023-11-19: 4 mg via INTRAVENOUS
  Filled 2023-11-19: qty 2

## 2023-11-19 MED ORDER — HYDROMORPHONE HCL 1 MG/ML IJ SOLN
0.5000 mg | Freq: Once | INTRAMUSCULAR | Status: AC
  Administered 2023-11-19: 0.5 mg via INTRAVENOUS
  Filled 2023-11-19: qty 0.5

## 2023-11-19 MED ORDER — LACTATED RINGERS IV SOLN: INTRAVENOUS | Status: AC

## 2023-11-19 MED ORDER — PIPERACILLIN-TAZOBACTAM 3.375 G IVPB: 3.3750 g | Freq: Three times a day (TID) | INTRAVENOUS | Status: DC

## 2023-11-19 NOTE — Progress Notes (Signed)
 Pt admitted for cholecystitis with gallstones. Surgery for tomorrow and ERCP after cholecystectomy. Pt A&OX4,  No c/o pain at this time. Pt not on tele. Pt a blood sugar. Pt started on LR@100 . Pt oriented to the room and no other needs at this time. Pt skin clean dry and intact. Aureliano VEAR Louder 11/19/23 6:43 PM BP 130/83   Pulse 65   Temp 98 F (36.7 C) (Oral)   Resp 20   Ht 6' 4 (1.93 m)   Wt 107.5 kg   SpO2 98%   BMI 28.85 kg/m

## 2023-11-19 NOTE — H&P (Signed)
 History and Physical    Patient: Frank Lucero FMW:[id number] DOB: Aug 15, 1980 DOA: 11/19/2023 DOS: the patient was seen and examined on 11/19/2023 PCP: [name], MD  Patient coming from: Home  Chief Complaint:  Chief Complaint  Patient presents with   Abdominal Pain   HPI: Frank Lucero is a 43 y.o. male with medical history significant of type 2 diabetes, Gilbert's syndrome, acid reflux, who presents to the hospital with abdominal pain. His symptoms started 2 or 3 months ago, intermittent.  Has been seen in the emergency room, was diagnosed with gallstone.  But symptom was much worse today, he had a sudden onset of right upper quadrant abdominal earlier this morning.  Severe 10/10, intermittent.  Pressure-like.  Associated with nausea without vomiting.  He was also diaphoretic without fever. Upon arriving to hospital, patient was afebrile, no significant leukocytosis.  AST 525, ALT 553, bilirubin 7.4.  MRCP showed gallstone with cholecystitis, small stone in the distal common bile duct.  Consult from general surgery and GI obtained.  Decision is made to have ERCP performed followed by cholecystectomy.  Patient also received IV Zosyn  Review of Systems: As mentioned in the history of present illness. All other systems reviewed and are negative. 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 Harvey: diverticulosis, redundant left colon, internal and external hemorrhoids   GIVENS CAPSULE STUDY  12/05/2010   SLF: NL TI AND COLON/Small internal hemorrhoids   UPPER GASTROINTESTINAL ENDOSCOPY  FEB 2011 [place], [place]   HH, CHRONIC GASTRITIS, DUODENITIS   Social History:  reports that he quit smoking about 14 years ago. His smoking use included cigarettes. He started smoking about 32 years ago. He has a 18 pack-year smoking history. He has been exposed to tobacco smoke. He has never used smokeless tobacco. He reports that he does not drink alcohol and does not use drugs.  Allergies  Allergen Reactions   Niacin And Related Rash    Family History  Problem Relation Age of Onset   Colon cancer Maternal Grandfather 64   Colon cancer Paternal Grandfather     Prior to Admission medications   Not on File    Physical Exam: Vitals:   11/19/23 1703 11/19/23 1706  BP: 103/67   Pulse: 64   Resp: 20   Temp: 98.3 F (36.8 C)   TempSrc: Oral   SpO2: 100%   Weight:  107.5 kg  Height:  6' 4 (1.93 m)   Physical Exam Constitutional:      General: He is not in acute distress.    Appearance: He is well-developed. He is not ill-appearing, toxic-appearing or diaphoretic.  HENT:     Head: Normocephalic and atraumatic.  Mouth/Throat:     Mouth: Mucous membranes are moist.   Eyes:     General: Scleral icterus present.     Extraocular Movements: Extraocular movements intact.     Pupils: Pupils are equal, round, and reactive to light.    Cardiovascular:     Rate and Rhythm: Normal rate and regular rhythm.     Heart sounds: No murmur heard. Pulmonary:     Effort: Pulmonary effort is normal. No respiratory distress.     Breath sounds: Normal breath sounds. No stridor.  Abdominal:     General: Abdomen is flat. There is no distension.     Palpations: Abdomen is soft.     Tenderness:  There is abdominal tenderness in the right lower quadrant. There is guarding. There is no rebound.   Skin:    General: Skin is warm and dry.     Coloration: Skin is jaundiced.   Neurological:     General: No focal deficit present.     Mental Status: He is alert and oriented to person, place, and time.   Psychiatric:        Mood and Affect: Mood normal.        Behavior: Behavior normal.      Data Reviewed:  Reviewed labs, CT scan and MRI results.  Assessment and Plan: Acute cystitis with cholelithiasis. Choledocholithiasis. Obstructive jaundice Currently, patient does not have sepsis.  Has been seen by general surgery, consult from GI also obtained.  Decision made to perform ERCP followed with cholecystectomy. He already received Zosyn, will continue for cholecystitis. Continue IV fluids.  Type 2 diabetes with hyperglycemia. Will start sliding scale insulin for now.      Advance Care Planning:   Code Status: Full Code patient is a full code.  Consults: GI and general surgery.  Family Communication: None  Severity of Illness: The appropriate patient status for this patient is INPATIENT. Inpatient status is judged to be reasonable and necessary in order to provide the required intensity of service to ensure the patient's safety. The patient's presenting symptoms, physical exam findings, and initial radiographic and laboratory data in the context of their chronic comorbidities is felt to place them at high risk for further clinical deterioration. Furthermore, it is not anticipated that the patient will be medically stable for discharge from the hospital within 2 midnights of admission.   * I certify that at the point of admission it is my clinical judgment that the patient will require inpatient hospital care spanning beyond 2 midnights from the point of admission due to high intensity of service, high risk for further deterioration and high frequency of surveillance  required.*  Author: Murvin Mana, MD 11/19/2023 5:37 PM  For on call review [email or web address].

## 2023-11-19 NOTE — ED Triage Notes (Signed)
 Patient arrived by [facility] from [facility] for [employer]. Reports stones stuck in duct.   Given dilaudid at [facility]  Patient had been told he has gallstones and GI appointment was scheduled for 7/3. Patient reports eating pizza yesterday and had to go back to ER to be seen for abdominal pain and N/V.  A&O x4 upon arrival

## 2023-11-19 NOTE — ED Notes (Signed)
 Pt placed on 2L via [place] secondary to low O2 sat pt satting 100% on 2L

## 2023-11-19 NOTE — Consult Note (Signed)
 [facility] Consult  Reason for Consult: RUQ pain, gallstones, elevated LFTs  Referring Physician:  Lonni Conger, PA   Chief Complaint   Abdominal Pain     HPI: Frank Lucero is a 43 y.o. male with RUQ pain and gallstones that was in the ED 6/29 and came back with worsening pain. His LFTs were so elevated that the lab had to dilute the specimen to read the Lfts. His Bilirubin was up to 7.4 and I asked the ED to get an MRCP while the LFTs were being processed due to this CBD being 6mm from 3mm on prior US .  He says his pain has continued despite pain medication yesterday. He appears jaundice during this conversation. There is some documentation of possible Gilbert's disease from GI notes from May due to a low elevated bilirubin in the 1.8-2.4 range. He also endorsed nausea and vomiting.   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 Harvey: diverticulosis, redundant left colon, internal and external hemorrhoids   GIVENS CAPSULE STUDY  12/05/2010   SLF: NL TI AND COLON/Small internal hemorrhoids   UPPER GASTROINTESTINAL ENDOSCOPY  FEB 2011 [place], [place]   HH, CHRONIC GASTRITIS, DUODENITIS    Family History  Problem Relation Age of Onset   Colon cancer Maternal Grandfather 30   Colon cancer  Paternal Grandfather     Social History   Tobacco Use   Smoking status: Former    Current packs/day: 0.00    Average packs/day: 1 pack/day for 18.0 years (18.0 ttl pk-yrs)    Types: Cigarettes    Start date: 11/11/1991    Quit date: 11/10/2009    Years since quitting: 14.0    Passive exposure: Past   Smokeless tobacco: Never  Vaping Use   Vaping status: Never Used  Substance Use Topics   Alcohol use: No   Drug use: No    Medications: I have reviewed the patient's current medications. No current outpatient medications   Allergies  Allergen Reactions   Niacin And Related Rash     ROS:  A comprehensive review of systems was negative except for: Constitutional: positive for jaundice Gastrointestinal: positive for abdominal pain, nausea, and vomiting  Blood pressure (!) 117/55, pulse 77, temperature 97.8 F (36.6 C), temperature source Oral, resp. rate 19, height 6' 4 (1.93 m), weight 112 kg, SpO2 100%. Physical Exam Vitals reviewed.  Constitutional:      Comments: jaundice  HENT:     Head: Normocephalic.   Eyes:     Extraocular Movements: Extraocular movements intact.    Cardiovascular:     Rate and Rhythm: Normal rate and regular rhythm.  Pulmonary:     Effort: Pulmonary effort is normal.  Abdominal:  General: There is no distension.     Palpations: Abdomen is soft.     Tenderness: There is abdominal tenderness in the right upper quadrant.   Musculoskeletal:     Comments: Moves all extremities    Skin:    General: Skin is warm.   Neurological:     General: No focal deficit present.     Mental Status: He is alert.   Psychiatric:        Mood and Affect: Mood normal.        Behavior: Behavior normal.     Results: Results for orders placed or performed during the hospital encounter of 11/19/23 (from the past 48 hours)  CBC with Differential     Status: Abnormal   Collection Time: 11/19/23 10:17 AM  Result Value Ref Range   WBC 11.1 (H) 4.0 -  10.5 K/uL   RBC 5.35 4.22 - 5.81 MIL/uL   Hemoglobin 16.1 13.0 - 17.0 g/dL   HCT 53.3 60.9 - 47.9 %   MCV 87.1 80.0 - 100.0 fL   MCH 30.1 26.0 - 34.0 pg   MCHC 34.5 30.0 - 36.0 g/dL   RDW 87.3 88.4 - 84.4 %   Platelets 239 150 - 400 K/uL   nRBC 0.0 0.0 - 0.2 %   Neutrophils Relative % 81 %   Neutro Abs 9.0 (H) 1.7 - 7.7 K/uL   Lymphocytes Relative 9 %   Lymphs Abs 1.0 0.7 - 4.0 K/uL   Monocytes Relative 8 %   Monocytes Absolute 0.9 0.1 - 1.0 K/uL   Eosinophils Relative 1 %   Eosinophils Absolute 0.1 0.0 - 0.5 K/uL   Basophils Relative 1 %   Basophils Absolute 0.1 0.0 - 0.1 K/uL   Immature Granulocytes 0 %   Abs Immature Granulocytes 0.03 0.00 - 0.07 K/uL    Comment: Performed at [facility], [address] [place], [place] [address]  Comprehensive metabolic panel with GFR     Status: Abnormal   Collection Time: 11/19/23 11:53 AM  Result Value Ref Range   Sodium 139 135 - 145 mmol/L   Potassium 4.6 3.5 - 5.1 mmol/L   Chloride 105 98 - 111 mmol/L   CO2 23 22 - 32 mmol/L   Glucose, Bld 110 (H) 70 - 99 mg/dL    Comment: Glucose reference range applies only to samples taken after fasting for at least 8 hours.   BUN 14 6 - 20 mg/dL   Creatinine, Ser 9.13 0.61 - 1.24 mg/dL   Calcium 9.2 8.9 - 89.6 mg/dL   Total Protein 6.8 6.5 - 8.1 g/dL   Albumin 4.0 3.5 - 5.0 g/dL   AST 474 (H) 15 - 41 U/L   ALT 553 (H) 0 - 44 U/L   Alkaline Phosphatase 234 (H) 38 - 126 U/L   Total Bilirubin 7.4 (H) 0.0 - 1.2 mg/dL   GFR, Estimated >39 >39 mL/min    Comment: (NOTE) Calculated using the CKD-EPI Creatinine Equation (2021)    Anion gap 11 5 - 15    Comment: Performed at [facility], [address], [place], [place] [address]  Lipase, blood     Status: None   Collection Time: 11/19/23 11:53 AM  Result Value Ref Range   Lipase 32 11 - 51 U/L    Comment: Performed at [facility], [address] [place], [place] [address]  CBC with Differential/Platelet     Status: None   Collection Time:  11/19/23 11:53 AM  Result Value Ref Range   WBC 9.1 4.0 - 10.5 K/uL   RBC 4.97 4.22 - 5.81 MIL/uL   Hemoglobin 14.9 13.0 - 17.0 g/dL   HCT 56.1 60.9 - 47.9 %   MCV 88.1 80.0 - 100.0 fL   MCH 30.0 26.0 - 34.0 pg   MCHC 34.0 30.0 - 36.0 g/dL   RDW 87.3 88.4 - 84.4 %   Platelets 213 150 - 400 K/uL   nRBC 0.0 0.0 - 0.2 %   Neutrophils Relative % 84 %   Neutro Abs 7.5 1.7 - 7.7 K/uL   Lymphocytes Relative 7 %   Lymphs Abs 0.7 0.7 - 4.0 K/uL   Monocytes Relative 9 %   Monocytes Absolute 0.8 0.1 - 1.0 K/uL   Eosinophils Relative 0 %   Eosinophils Absolute 0.0 0.0 - 0.5 K/uL   Basophils Relative 0 %   Basophils Absolute 0.0 0.0 - 0.1 K/uL   Immature Granulocytes 0 %   Abs Immature Granulocytes 0.04 0.00 - 0.07 K/uL    Comment: Performed at [facility], [address], [place], [place] [address]   Personally reviewed- CBD dilated on the US  from prior 3mm to 6mm now. Asked for MRCP and reviewed it, filling defect noted  MR ABDOMEN MRCP W WO CONTAST Result Date: 11/19/2023 CLINICAL DATA:  Cholelithiasis; Cholelithiasis 144615 Pain Q012017. EXAM: MRI ABDOMEN WITHOUT AND WITH CONTRAST (INCLUDING MRCP) TECHNIQUE: Multiplanar multisequence MR imaging of the abdomen was performed both before and after the administration of intravenous contrast. Heavily T2-weighted images of the biliary and pancreatic ducts were obtained, and three-dimensional MRCP images were rendered by post processing. CONTRAST:  10mL GADAVIST GADOBUTROL 1 MMOL/ML IV SOLN COMPARISON:  Ultrasound abdomen from 11/19/2023 and CT scan abdomen and pelvis from 11/03/2023. FINDINGS: Lower chest: Unremarkable MR appearance to the lung bases. No pleural effusion. No pericardial effusion. Normal heart size. Hepatobiliary: The liver is normal in size and configuration. The gallbladder is distended and exhibit mild diffuse gallbladder wall thickening/edema. There is also mild pericholecystic fat stranding and trace amount of pericholecystic fluid.  No discrete cholelithiasis seen. Findings favor acute cholecystitis. No intra or extrahepatic bile duct dilation. There is a single 2 mm dependent filling defect in the distal common bile duct (series 4, image 26), which may represent a tiny choledocholithiasis. Pancreas: No mass, inflammatory changes or other parenchymal abnormality identified. No main pancreatic duct dilation. Spleen:  Within normal limits in size and appearance. No focal mass. Adrenals/Urinary Tract: Unremarkable adrenal glands. No hydroureteronephrosis. No suspicious renal mass. Stomach/Bowel: Visualized portions within the abdomen are unremarkable. No disproportionate dilation of bowel loops. Vascular/Lymphatic: No pathologically enlarged lymph nodes identified. No abdominal aortic aneurysm demonstrated. No ascites. Other:  None. Musculoskeletal: No suspicious bone lesions identified. IMPRESSION: 1. Findings favor acute cholecystitis. There is a single 2 mm dependent filling defect in the distal common bile duct, which may represent a tiny choledocholithiasis. No intra or extrahepatic bile duct dilation. 2. Otherwise essentially unremarkable exam, as described above. Electronically Signed   By: Ree Molt M.D.   On: 11/19/2023 14:39   MR 3D Recon At Scanner Result Date: 11/19/2023 CLINICAL DATA:  Cholelithiasis; Cholelithiasis 144615 Pain Q012017. EXAM: MRI ABDOMEN WITHOUT AND WITH CONTRAST (INCLUDING MRCP) TECHNIQUE: Multiplanar multisequence MR imaging of the abdomen was performed both before and after the administration of intravenous contrast. Heavily T2-weighted images of the biliary and pancreatic ducts were obtained, and three-dimensional MRCP images were rendered by post processing. CONTRAST:  10mL GADAVIST GADOBUTROL 1  MMOL/ML IV SOLN COMPARISON:  Ultrasound abdomen from 11/19/2023 and CT scan abdomen and pelvis from 11/03/2023. FINDINGS: Lower chest: Unremarkable MR appearance to the lung bases. No pleural effusion. No  pericardial effusion. Normal heart size. Hepatobiliary: The liver is normal in size and configuration. The gallbladder is distended and exhibit mild diffuse gallbladder wall thickening/edema. There is also mild pericholecystic fat stranding and trace amount of pericholecystic fluid. No discrete cholelithiasis seen. Findings favor acute cholecystitis. No intra or extrahepatic bile duct dilation. There is a single 2 mm dependent filling defect in the distal common bile duct (series 4, image 26), which may represent a tiny choledocholithiasis. Pancreas: No mass, inflammatory changes or other parenchymal abnormality identified. No main pancreatic duct dilation. Spleen:  Within normal limits in size and appearance. No focal mass. Adrenals/Urinary Tract: Unremarkable adrenal glands. No hydroureteronephrosis. No suspicious renal mass. Stomach/Bowel: Visualized portions within the abdomen are unremarkable. No disproportionate dilation of bowel loops. Vascular/Lymphatic: No pathologically enlarged lymph nodes identified. No abdominal aortic aneurysm demonstrated. No ascites. Other:  None. Musculoskeletal: No suspicious bone lesions identified. IMPRESSION: 1. Findings favor acute cholecystitis. There is a single 2 mm dependent filling defect in the distal common bile duct, which may represent a tiny choledocholithiasis. No intra or extrahepatic bile duct dilation. 2. Otherwise essentially unremarkable exam, as described above. Electronically Signed   By: Ree Molt M.D.   On: 11/19/2023 14:39   US  Abdomen Limited RUQ (LIVER/GB) Result Date: 11/19/2023 CLINICAL DATA:  Right upper quadrant pain EXAM: ULTRASOUND ABDOMEN LIMITED RIGHT UPPER QUADRANT COMPARISON:  Ultrasound and CT 11/03/2023 FINDINGS: Gallbladder: Dilated gallbladder. Few shadowing stones. Slight areas of wall thickening up to 4 cm. No perinephric fluid however the sonographer reports pain when scanning the gallbladder. Common bile duct: Diameter: 6 mm,  borderline enlarged Liver: No focal lesion identified. Within normal limits in parenchymal echogenicity. Portal vein is patent on color Doppler imaging with normal direction of blood flow towards the liver. Other: None. IMPRESSION: Dilated gallbladder. Stones are seen with slight wall thickening. Is also reported Murphy's sign. Please correlate for other clinical evidence of acute cholecystitis and if needed further workup with HIDA scan as clinically appropriate. Common duct at the upper limits of normal 6 mm. Electronically Signed   By: Ranell Bring M.D.   On: 11/19/2023 10:44     Assessment & Plan:  Frank Lucero is a 43 y.o. male with RUQ pain, stones, and MRCP with concern for filling defect and acute cholecystitis. Discussed case with Dr. Jinny, GI at [facility] who does ERCPs. He is happy to do ERCP.  -Transfer patient Frank Lucero for ERCP -Dr. Jinny wants patient admitted to hospitalist -Patient will need Cholecystectomy post ERCP, and he can get this as inpatient with Surgery at [facility] if appropriate or can follow up with me to get it ASAP post ERCP  -Zosyn received in the ED, he will need to continue for the Cholecystitis and prevent cholangitis -Discussed plan with the ED    All questions were answered to the satisfaction of the patient and family.    Frank Lucero 11/19/2023, 2:53 PM

## 2023-11-19 NOTE — ED Provider Notes (Signed)
 [place] EMERGENCY DEPARTMENT AT [facility] Provider Note   CSN: [id number] Arrival date & time: 11/19/23  9056     Patient presents with: Abdominal Pain   Frank Lucero is a 43 y.o. male.   Patient is a 43 year old male who presents to the emergency department the chief complaint of worsening epigastric and right upper quadrant abdominal pain.  He was evaluated in the emergency department yesterday during which time he had blood work and pain medications were given.  It does appear that symptoms improved and he was discharged home.  He notes he has had continued ongoing worsening pain throughout the night.  He notes that he is due to see general surgery on 7/3 for consult for possible cholecystectomy.  He does admit to associated nausea and vomiting.   Abdominal Pain      Prior to Admission medications   Not on File    Allergies: Niacin and related    Review of Systems  Gastrointestinal:  Positive for abdominal pain.  All other systems reviewed and are negative.   Updated Vital Signs BP (!) 108/55   Pulse 66   Temp 98 F (36.7 C)   Resp 20   Ht 6' 4 (1.93 m)   Wt 112 kg   SpO2 100%   BMI 30.06 kg/m   Physical Exam Vitals and nursing note reviewed.  Constitutional:      Appearance: Normal appearance.  HENT:     Head: Normocephalic and atraumatic.     Nose: Nose normal.     Mouth/Throat:     Mouth: Mucous membranes are moist.   Eyes:     Extraocular Movements: Extraocular movements intact.     Conjunctiva/sclera: Conjunctivae normal.     Pupils: Pupils are equal, round, and reactive to light.    Cardiovascular:     Rate and Rhythm: Normal rate and regular rhythm.     Pulses: Normal pulses.     Heart sounds: Normal heart sounds. No murmur heard.    No gallop.  Pulmonary:     Effort: Pulmonary effort is normal. No respiratory distress.     Breath sounds: Normal breath sounds. No stridor. No wheezing, rhonchi or rales.  Abdominal:      General: Abdomen is flat. Bowel sounds are normal. There is no distension.     Palpations: Abdomen is soft. There is no mass.     Tenderness: There is abdominal tenderness in the right upper quadrant. Positive signs include Murphy's sign. Negative signs include McBurney's sign.     Hernia: No hernia is present.   Musculoskeletal:        General: Normal range of motion.     Cervical back: Normal range of motion and neck supple.   Skin:    General: Skin is warm and dry.   Neurological:     General: No focal deficit present.     Mental Status: He is alert and oriented to person, place, and time. Mental status is at baseline.   Psychiatric:        Mood and Affect: Mood normal.        Behavior: Behavior normal.        Thought Content: Thought content normal.        Judgment: Judgment normal.     (all labs ordered are listed, but only abnormal results are displayed) Labs Reviewed  CBC WITH DIFFERENTIAL/PLATELET - Abnormal; Notable for the following components:      Result Value  WBC 11.1 (*)    Neutro Abs 9.0 (*)    All other components within normal limits  COMPREHENSIVE METABOLIC PANEL WITH GFR - Abnormal; Notable for the following components:   Glucose, Bld 110 (*)    AST 525 (*)    ALT 553 (*)    Alkaline Phosphatase 234 (*)    Total Bilirubin 7.4 (*)    All other components within normal limits  LIPASE, BLOOD  CBC WITH DIFFERENTIAL/PLATELET    EKG: None  Radiology: US  Abdomen Limited RUQ (LIVER/GB) Result Date: 11/19/2023 CLINICAL DATA:  Right upper quadrant pain EXAM: ULTRASOUND ABDOMEN LIMITED RIGHT UPPER QUADRANT COMPARISON:  Ultrasound and CT 11/03/2023 FINDINGS: Gallbladder: Dilated gallbladder. Few shadowing stones. Slight areas of wall thickening up to 4 cm. No perinephric fluid however the sonographer reports pain when scanning the gallbladder. Common bile duct: Diameter: 6 mm, borderline enlarged Liver: No focal lesion identified. Within normal limits in  parenchymal echogenicity. Portal vein is patent on color Doppler imaging with normal direction of blood flow towards the liver. Other: None. IMPRESSION: Dilated gallbladder. Stones are seen with slight wall thickening. Is also reported Murphy's sign. Please correlate for other clinical evidence of acute cholecystitis and if needed further workup with HIDA scan as clinically appropriate. Common duct at the upper limits of normal 6 mm. Electronically Signed   By: Ranell Bring M.D.   On: 11/19/2023 10:44     Procedures   Medications Ordered in the ED  HYDROmorphone (DILAUDID) injection 1 mg (1 mg Intravenous Given 11/19/23 1016)  ondansetron  (ZOFRAN ) injection 4 mg (4 mg Intravenous Given 11/19/23 1016)  dicyclomine  (BENTYL ) injection 20 mg (20 mg Intramuscular Given 11/19/23 1016)  sodium chloride  0.9 % bolus 1,000 mL (0 mLs Intravenous Stopped 11/19/23 1143)  ketorolac  (TORADOL ) 15 MG/ML injection 15 mg (15 mg Intravenous Given 11/19/23 1103)  piperacillin-tazobactam (ZOSYN) IVPB 3.375 g (0 g Intravenous Stopped 11/19/23 1212)  HYDROmorphone (DILAUDID) injection 0.5 mg (0.5 mg Intravenous Given 11/19/23 1252)    Clinical Course as of 11/19/23 1537  Mon Nov 19, 2023  1401 US  Abdomen Limited RUQ (LIVER/GB) [SS]  1403 MR ABDOMEN MRCP W WO CONTAST [SS]    Clinical Course User Index [SS] Stotelmyre, Sera, Student-PA                                 Medical Decision Making Amount and/or Complexity of Data Reviewed Labs: ordered. Radiology: ordered.  Risk Prescription drug management. Decision regarding hospitalization.   This patient presents to the ED for concern of abdominal pain, this involves an extensive number of treatment options, and is a complaint that carries with it a high risk of complications and morbidity.  The differential diagnosis includes acute appendicitis, cholecystitis, bowel torsion, diverticulitis, testicular torsion, pyelonephritis, kidney stone, pancreatitis, small bowel  obstruction, mesenteric ischemia, choledocholithiasis, cholangitis   Co morbidities that complicate the patient evaluation  Previous cholelithiasis   Additional history obtained:  Additional history obtained from medical records External records from outside source obtained and reviewed including medical records   Lab Tests:  I Ordered, and personally interpreted labs.  The pertinent results include: No leukocytosis, no anemia, normal kidney function, normal electrolytes, elevated liver function and bilirubin, normal lipase   Imaging Studies ordered:  I ordered imaging studies including ultrasound right upper quadrant, MRCP I independently visualized and interpreted imaging which showed acute cholecystitis with choledocholithiasis I agree with the radiologist interpretation  Consultations Obtained:  I requested consultation with the general surgery, gastroenterology, Dr. Kallie, Dr. Jinny,  and discussed lab and imaging findings as well as pertinent plan - they recommend: Transfer to [facility], ERCP   Problem List / ED Course / Critical interventions / Medication management  Patient does remain stable at this time.  Pain has improved with treatment in the emergency department.  MRCP is concerning for choledocholithiasis.  He does have elevated liver enzymes and bilirubin at this point as well.  He also has concerning findings for acute cholecystitis.  Did initially obtain consult with Dr. Kallie with general surgery but after the results of the MRCP we did obtain consult with Dr. Jinny with gastroenterology at [facility].  He did recommend transfer to their facility for ERCP.  Have discussed patient case with the ER physician, Dr Dorothyann at [facility] who has accepted for transfer at this time.  Transport is being arranged at this time.  Patient has been given dose of Zosyn.  Do not suspect acute cholangitis at this point I ordered medication including  Dilaudid, Bentyl , Toradol , Zofran , IV fluids for acute cholecystitis and choledocholithiasis Reevaluation of the patient after these medicines showed that the patient improved I have reviewed the patients home medicines and have made adjustments as needed   Social Determinants of Health:  None   Test / Admission - Considered:  Transfer for GI and ERCP     Final diagnoses:  Acute cholecystitis    ED Discharge Orders     None          Daralene Lonni JONETTA DEVONNA 11/19/23 1546    Melvenia Motto, MD 11/19/23 1723

## 2023-11-19 NOTE — ED Triage Notes (Signed)
 Pt arrived via POV c/o recurrent abdominal pain. Pt reports being seen here yesterday for same complaint.

## 2023-11-19 NOTE — ED Provider Notes (Addendum)
 [facility] Provider Note    Event Date/Time   First MD Initiated Contact with Patient 11/19/23 1700     (approximate)   History   Chief Complaint: Abdominal Pain   HPI  Frank Lucero is a 43 y.o. male with a history of GERD, prior diabetes and hyperlipidemia which resolved with substantial weight loss who is transferred to [facility] ED from [facility], ED for ERCP.  Patient reports he has been having intermittent right upper quadrant pain for the past few months occurring nearly every day, worse with eating, radiating to his back.  The last several days it has been even more intense and persistent.  He was Anapen yesterday, felt better after receiving multiple pain medications, went home and within 10 minutes had recurrent severe pain.  At [facility], LFTs were markedly elevated.  MRCP showed evidence of cholecystitis as well as a likely 2 mm stone in the distal CBD.  Staff there discussed with general surgery and [facility]  Patient rates pain as mild currently.      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 Harvey: diverticulosis, redundant left colon, internal and external hemorrhoids   GIVENS CAPSULE STUDY  12/05/2010   SLF: NL TI AND COLON/Small internal hemorrhoids   UPPER  GASTROINTESTINAL ENDOSCOPY  FEB 2011 [place], [place]   HH, CHRONIC GASTRITIS, DUODENITIS    Physical Exam   Triage Vital Signs: ED Triage Vitals  Encounter Vitals Group     BP 11/19/23 1703 103/67     Girls Systolic BP Percentile --      Girls Diastolic BP Percentile --      Boys Systolic BP Percentile --      Boys Diastolic BP Percentile --      Pulse Rate 11/19/23 1703 64     Resp 11/19/23 1703 20     Temp 11/19/23 1703 98.3 F (36.8 C)     Temp Source 11/19/23 1703 Oral     SpO2 11/19/23 1703 100 %     Weight 11/19/23 1706 237 lb (107.5 kg)     Height 11/19/23 1706 6' 4 (1.93 m)     Head Circumference --      Peak Flow --      Pain Score 11/19/23 1705 2     Pain Loc --      Pain Education --      Exclude from Growth Chart --     Most recent vital signs: Vitals:   11/19/23 1703  BP: 103/67  Pulse: 64  Resp: 20  Temp: 98.3 F (36.8 C)  SpO2: 100%    General: Awake, no distress.  CV:  Good peripheral perfusion.  Regular rate rhythm Resp:  Normal effort.  Auscultation Abd:  No distention.  Soft nontender Other:  Scleral icterus   ED Results / Procedures / Treatments   Labs (all labs ordered are listed, but only abnormal results are displayed) Labs Reviewed - No data to display   EKG    RADIOLOGY    PROCEDURES:  Procedures   MEDICATIONS ORDERED IN ED: Medications  piperacillin-tazobactam (ZOSYN) IVPB 3.375 g (has no administration in time range)     IMPRESSION / MDM / ASSESSMENT AND PLAN / ED COURSE  I reviewed the triage vital signs and the nursing notes.  Patient's presentation is most consistent with acute presentation with potential threat to life or bodily function.  Patient transferred for admission to [facility] for ERCP due to lack of this capability at [facility].  Workup is already been completed, establishing diagnosis of choledocholithiasis and cholecystitis.  She received Zosyn around 12:00 PM today, will plan for repeat dose at 6:00 PM.  GI  notified of patient's arrival.  Will contact hospitalist.    ----------------------------------------- 6:02 PM on 11/19/2023 ----------------------------------------- Case discussed with hospitalist.       FINAL CLINICAL IMPRESSION(S) / ED DIAGNOSES   Final diagnoses:  Calculus of gallbladder and bile duct with acute cholecystitis, with obstruction     Rx / DC Orders   ED Discharge Orders     None        Note:  This document was prepared using Dragon voice recognition software and may include unintentional dictation errors.   Viviann Pastor, MD 11/19/23 ZEB    Viviann Pastor, MD 11/19/23 [phone]

## 2023-11-19 NOTE — Telephone Encounter (Signed)
 Spoke to pt's wife (DPR) she informed me that pt was in the ED and was going to have gallbladder surgery.

## 2023-11-20 ENCOUNTER — Encounter: Payer: Self-pay | Admitting: Internal Medicine

## 2023-11-20 ENCOUNTER — Observation Stay: Admitting: Certified Registered"

## 2023-11-20 ENCOUNTER — Encounter
Admission: EM | Disposition: A | Discharge status: Home / Self Care | Payer: Self-pay | Source: Intra-hospital | Attending: Internal Medicine

## 2023-11-20 ENCOUNTER — Observation Stay

## 2023-11-20 DIAGNOSIS — K838 Other specified diseases of biliary tract: Secondary | ICD-10-CM | POA: Diagnosis not present

## 2023-11-20 DIAGNOSIS — K8062 Calculus of gallbladder and bile duct with acute cholecystitis without obstruction: Secondary | ICD-10-CM | POA: Diagnosis not present

## 2023-11-20 DIAGNOSIS — K8001 Calculus of gallbladder with acute cholecystitis with obstruction: Secondary | ICD-10-CM | POA: Diagnosis not present

## 2023-11-20 DIAGNOSIS — K8040 Calculus of bile duct with cholecystitis, unspecified, without obstruction: Secondary | ICD-10-CM | POA: Diagnosis not present

## 2023-11-20 DIAGNOSIS — K8063 Calculus of gallbladder and bile duct with acute cholecystitis with obstruction: Principal | ICD-10-CM

## 2023-11-20 DIAGNOSIS — R748 Abnormal levels of other serum enzymes: Secondary | ICD-10-CM

## 2023-11-20 DIAGNOSIS — R7989 Other specified abnormal findings of blood chemistry: Secondary | ICD-10-CM

## 2023-11-20 HISTORY — PX: ERCP: SHX5425

## 2023-11-20 LAB — COMPREHENSIVE METABOLIC PANEL WITH GFR
ALT: 559 U/L — ABNORMAL HIGH (ref 0–44)
AST: 338 U/L — ABNORMAL HIGH (ref 15–41)
Albumin: 3.6 g/dL (ref 3.5–5.0)
Alkaline Phosphatase: 257 U/L — ABNORMAL HIGH (ref 38–126)
Anion gap: 8 (ref 5–15)
BUN: 12 mg/dL (ref 6–20)
CO2: 27 mmol/L (ref 22–32)
Calcium: 9 mg/dL (ref 8.9–10.3)
Chloride: 108 mmol/L (ref 98–111)
Creatinine, Ser: 0.92 mg/dL (ref 0.61–1.24)
GFR, Estimated: >60 mL/min (ref >60)
Glucose, Bld: 96 mg/dL (ref 70–99)
Potassium: 5 mmol/L (ref 3.5–5.1)
Sodium: 141 mmol/L (ref 135–145)
Total Bilirubin: 7.2 mg/dL — ABNORMAL HIGH (ref 0.0–1.2)
Total Protein: 6 g/dL — ABNORMAL LOW (ref 6.5–8.1)

## 2023-11-20 LAB — GLUCOSE, CAPILLARY
Glucose-Capillary: 104 mg/dL — ABNORMAL HIGH (ref 70–99)
Glucose-Capillary: 104 mg/dL — ABNORMAL HIGH (ref 70–99)
Glucose-Capillary: 94 mg/dL (ref 70–99)

## 2023-11-20 LAB — MAGNESIUM: Magnesium: 2.2 mg/dL (ref 1.7–2.4)

## 2023-11-20 LAB — CBC
HCT: 41.7 % (ref 39.0–52.0)
Hemoglobin: 13.6 g/dL (ref 13.0–17.0)
MCH: 29.2 pg (ref 26.0–34.0)
MCHC: 32.6 g/dL (ref 30.0–36.0)
MCV: 89.7 fL (ref 80.0–100.0)
Platelets: 182 10*3/uL (ref 150–400)
RBC: 4.65 MIL/uL (ref 4.22–5.81)
RDW: 12.9 % (ref 11.5–15.5)
WBC: 7.2 10*3/uL (ref 4.0–10.5)
nRBC: 0 % (ref 0.0–0.2)

## 2023-11-20 LAB — HIV ANTIBODY (ROUTINE TESTING W REFLEX): HIV Screen 4th Generation wRfx: NONREACTIVE

## 2023-11-20 LAB — HEMOGLOBIN A1C
Hgb A1c MFr Bld: 4.3 % — ABNORMAL LOW (ref 4.8–5.6)
Mean Plasma Glucose: 76.71 mg/dL

## 2023-11-20 SURGERY — ERCP, WITH INTERVENTION IF INDICATED
Anesthesia: General

## 2023-11-20 MED ORDER — LIDOCAINE HCL (PF) 2 % IJ SOLN
INTRAMUSCULAR | Status: DC | PRN
  Administered 2023-11-20: 100 mg via INTRADERMAL

## 2023-11-20 MED ORDER — PROPOFOL 10 MG/ML IV BOLUS
INTRAVENOUS | Status: AC
Start: 2023-11-20 — End: 2023-11-20
  Filled 2023-11-20: qty 20

## 2023-11-20 MED ORDER — DICLOFENAC SUPPOSITORY 100 MG
100.0000 mg | Freq: Once | RECTAL | Status: DC
Start: 2023-11-20 — End: 2023-11-23
  Filled 2023-11-20: qty 1

## 2023-11-20 MED ORDER — ONDANSETRON HCL 4 MG/2ML IJ SOLN
4.0000 mg | Freq: Four times a day (QID) | INTRAMUSCULAR | Status: DC | PRN
  Administered 2023-11-20: 4 mg via INTRAVENOUS
  Filled 2023-11-20: qty 2

## 2023-11-20 MED ORDER — GLYCOPYRROLATE 0.2 MG/ML IJ SOLN
INTRAMUSCULAR | Status: DC | PRN
  Administered 2023-11-20: .2 mg via INTRAVENOUS

## 2023-11-20 MED ORDER — PROPOFOL 10 MG/ML IV BOLUS
INTRAVENOUS | Status: AC
  Filled 2023-11-20: qty 40

## 2023-11-20 MED ORDER — DEXMEDETOMIDINE HCL IN NACL 80 MCG/20ML IV SOLN
INTRAVENOUS | Status: DC | PRN
  Administered 2023-11-20 (×2): 8 ug via INTRAVENOUS
  Administered 2023-11-20: 4 ug via INTRAVENOUS

## 2023-11-20 MED ORDER — GLYCOPYRROLATE 0.2 MG/ML IJ SOLN
INTRAMUSCULAR | Status: AC
  Filled 2023-11-20: qty 2

## 2023-11-20 MED ORDER — DICLOFENAC SUPPOSITORY 100 MG
100.0000 mg | Freq: Once | RECTAL | Status: AC
  Administered 2023-11-20: 100 mg via RECTAL

## 2023-11-20 MED ORDER — LACTATED RINGERS IV SOLN: INTRAVENOUS | Status: AC

## 2023-11-20 MED ORDER — PROPOFOL 10 MG/ML IV BOLUS
INTRAVENOUS | Status: AC
  Filled 2023-11-20: qty 20

## 2023-11-20 MED ORDER — ACETAMINOPHEN 10 MG/ML IV SOLN
1000.0000 mg | Freq: Once | INTRAVENOUS | Status: AC
  Administered 2023-11-20: 1000 mg via INTRAVENOUS
  Filled 2023-11-20: qty 100

## 2023-11-20 MED ORDER — DICLOFENAC SUPPOSITORY 100 MG
RECTAL | Status: AC
  Filled 2023-11-20: qty 1

## 2023-11-20 MED ORDER — LIDOCAINE HCL (PF) 2 % IJ SOLN
INTRAMUSCULAR | Status: AC
Start: 2023-11-20 — End: 2023-11-20
  Filled 2023-11-20: qty 5

## 2023-11-20 MED ORDER — PROPOFOL 10 MG/ML IV BOLUS
INTRAVENOUS | Status: DC | PRN
  Administered 2023-11-20: 120 ug/kg/min via INTRAVENOUS
  Administered 2023-11-20: 120 mg via INTRAVENOUS

## 2023-11-20 MED ORDER — PHENYLEPHRINE 80 MCG/ML (10ML) SYRINGE FOR IV PUSH (FOR BLOOD PRESSURE SUPPORT)
PREFILLED_SYRINGE | INTRAVENOUS | Status: AC
  Filled 2023-11-20: qty 10

## 2023-11-20 NOTE — Consult Note (Signed)
 [facility] SURGICAL CONSULTATION NOTE (initial) - cpt: [id number]   HISTORY OF PRESENT ILLNESS (HPI):  43 y.o. male with history of multiple presentation to [facility] secondary to abdominal pain in the last 2 weeks. Initial presentation was on 06/14 for epigastric abdominal pain with associated nausea and emesis. Work up at that time was equivocal for cholecystitis and thought to have biliary colic. He was given surgical follow up at that time. Unfortunately, he had recurrence of pain on 06/29 which prompted return to the ED. His initial labs at that time were reassuring aside from a very mild elevation in his bilirubin to 1.4. He got analgesics in the ED and felt better and was discharged home. However, he had almost immediate recurrence of pain at home and presented again to the ED yesterday. This pain has always been epigastric and in RUQ with accompanying nausea and emesis. This has been post-prandial in nature as well over the last few weeks/months. No reported fever. He does have a history of recent weight loss >100 lbs. No previous abdominal surgeries. Most recent work up at [facility] revealed a total bilirubin to 7.4 with elevation in LFTs. WBC elevated to 11.1K. RUQ US  was concerning for cholelithiasis with wall thickening and noted positive [employer]. He did get MRCP as well concerning for choledocholithiasis and cholecystitis. He was transferred to [facility] due to need for ERCP. Currently on Zosyn.   Surgery is consulted by hospitalist physician Dr. Murvin Mana, MD in this context for evaluation and management of choledocholithiasis and cholecystitis.  PAST MEDICAL HISTORY (PMH):  Past Medical History:  Diagnosis Date   GERD (gastroesophageal reflux disease) 06/25/2009   EGD Dr Harvey small HH, mild gastritis, duodenitis   Bertrum syndrome 2011 T BILI 1.6   2014: 7 BILI 1.4 IDBILI 1.2   Helicobacter pylori gastritis 06/2009   (SEROLOGY POS [facility]/ s/p treatment ), Bx NEG FEB  2011   Hemorrhoids    NASH (nonalcoholic steatohepatitis) JAN 2011 305 LBS, BMI 37.14 Aug 2009 ABD U/S-FATTY LIVER   Obesity    Type 2 diabetes mellitus (HCC)    diet controlled     PAST SURGICAL HISTORY (PSH):  Past Surgical History:  Procedure Laterality Date   Benign 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 Harvey: diverticulosis, redundant left colon, internal and external hemorrhoids   GIVENS CAPSULE STUDY  12/05/2010   SLF: NL TI AND COLON/Small internal hemorrhoids   UPPER GASTROINTESTINAL ENDOSCOPY  FEB 2011 [place], [place]   HH, CHRONIC GASTRITIS, DUODENITIS     MEDICATIONS:  Prior to Admission medications   Not on File     ALLERGIES:  Allergies  Allergen Reactions   Niacin And Related Rash     SOCIAL HISTORY:  Social History   Socioeconomic History   Marital status: Single    Spouse name: Not on file   Number of children: 2   Years of education: Not on file   Highest education level: Not on file  Occupational History   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  Tobacco Use   Smoking status: Former    Current packs/day: 0.00    Average packs/day: 1 pack/day for 18.0 years (18.0 ttl pk-yrs)    Types: Cigarettes    Start date: 11/11/1991    Quit  date: 11/10/2009    Years since quitting: 14.0    Passive exposure: Past   Smokeless tobacco: Never  Vaping Use   Vaping status: Never Used  Substance and Sexual Activity   Alcohol use: No   Drug use: No   Sexual activity: Not on file  Other Topics Concern   Not on file  Social History Narrative   Not on file   Social Drivers of Health   Financial Resource Strain: Not on file  Food Insecurity: No Food Insecurity (11/19/2023)   Hunger Vital Sign    Worried About Running Out of Food in the Last Year: Never true    Ran Out of Food in the Last Year:  Never true  Transportation Needs: No Transportation Needs (11/19/2023)   PRAPARE - Administrator, Civil Service (Medical): No    Lack of Transportation (Non-Medical): No  Physical Activity: Not on file  Stress: Not on file  Social Connections: Not on file  Intimate Partner Violence: Not At Risk (11/19/2023)   Humiliation, Afraid, Rape, and Kick questionnaire    Fear of Current or Ex-Partner: No    Emotionally Abused: No    Physically Abused: No    Sexually Abused: No     FAMILY HISTORY:  Family History  Problem Relation Age of Onset   Colon cancer Maternal Grandfather 24   Colon cancer Paternal Grandfather       REVIEW OF SYSTEMS:  Review of Systems  Constitutional:  Negative for chills and fever.  Respiratory:  Negative for cough and shortness of breath.   Cardiovascular:  Negative for chest pain and palpitations.  Gastrointestinal:  Positive for abdominal pain, nausea and vomiting. Negative for constipation and diarrhea.  Genitourinary:  Negative for dysuria and urgency.  All other systems reviewed and are negative.   VITAL SIGNS:  Temp:  [97.8 F (36.6 C)-98.3 F (36.8 C)] 98.1 F (36.7 C) (07/01 0208) Pulse Rate:  [50-85] 62 (07/01 0208) Resp:  [16-27] 18 (07/01 0208) BP: (102-130)/(55-83) 104/67 (07/01 0208) SpO2:  [93 %-100 %] 100 % (07/01 0208) Weight:  [107.5 kg-112 kg] 107.5 kg (06/30 1706)     Height: 6' 4 (193 cm) Weight: 107.5 kg BMI (Calculated): 28.86   INTAKE/OUTPUT:  No intake/output data recorded.  PHYSICAL EXAM:  Physical Exam Vitals and nursing note reviewed. Exam conducted with a chaperone present.  Constitutional:      General: He is not in acute distress.    Appearance: He is well-developed and normal weight.  HENT:     Head: Normocephalic and atraumatic.   Eyes:     General: Scleral icterus present.     Extraocular Movements: Extraocular movements intact.    Cardiovascular:     Rate and Rhythm: Normal rate.     Heart  sounds: Normal heart sounds. No murmur heard. Pulmonary:     Effort: Pulmonary effort is normal. No respiratory distress.  Abdominal:     General: Abdomen is flat. There is no distension.     Palpations: Abdomen is soft.     Tenderness: There is abdominal tenderness in the right upper quadrant and epigastric area. There is no guarding or rebound.  Genitourinary:    Comments: Deferred  Skin:    General: Skin is warm and dry.     Coloration: Skin is jaundiced.   Neurological:     General: No focal deficit present.     Mental Status: He is alert and oriented to person, place, and time.  Psychiatric:        Mood and Affect: Mood normal.        Behavior: Behavior normal.      Labs:     Latest Ref Rng & Units 11/20/2023    3:59 AM 11/19/2023    6:40 PM 11/19/2023   11:53 AM  CBC  WBC 4.0 - 10.5 K/uL 7.2  8.7  9.1   Hemoglobin 13.0 - 17.0 g/dL 86.3  84.5  85.0   Hematocrit 39.0 - 52.0 % 41.7  45.4  43.8   Platelets 150 - 400 K/uL 182  196  213       Latest Ref Rng & Units 11/20/2023    3:59 AM 11/19/2023    6:40 PM 11/19/2023   11:53 AM  CMP  Glucose 70 - 99 mg/dL 96   889   BUN 6 - 20 mg/dL 12   14   Creatinine 9.38 - 1.24 mg/dL 9.07  9.09  9.13   Sodium 135 - 145 mmol/L 141   139   Potassium 3.5 - 5.1 mmol/L 5.0   4.6   Chloride 98 - 111 mmol/L 108   105   CO2 22 - 32 mmol/L 27   23   Calcium 8.9 - 10.3 mg/dL 9.0   9.2   Total Protein 6.5 - 8.1 g/dL 6.0   6.8   Total Bilirubin 0.0 - 1.2 mg/dL 7.2   7.4   Alkaline Phos 38 - 126 U/L 257   234   AST 15 - 41 U/L 338   525   ALT 0 - 44 U/L 559   553     Imaging studies:   RUQ US  (11/19/2023) personally reviewed with cholelithiasis and changes concerning for possible cholecystitis, and radiologist report reviewed below:  IMPRESSION: Dilated gallbladder. Stones are seen with slight wall thickening. Is also reported Murphy's sign. Please correlate for other clinical evidence of acute cholecystitis and if needed further  workup with HIDA scan as clinically appropriate.   Common duct at the upper limits of normal 6 mm.   MRCP (06/630/2025) personally reviewed and concerning for choledocholithiasis, and radiologist report reviewed below:  IMPRESSION: 1. Findings favor acute cholecystitis. There is a single 2 mm dependent filling defect in the distal common bile duct, which may represent a tiny choledocholithiasis. No intra or extrahepatic bile duct dilation. 2. Otherwise essentially unremarkable exam, as described above.     Assessment/Plan:  43 y.o. male with choledocholithiasis and acute cholecystitis .   - Appreciate medicine admission - Appreciate GI assistance; plan for ERCP today   - He will benefit from cholecystectomy this admission. We will plan for robotic assisted laparoscopic cholecystectomy tomorrow (07/02) with Dr Marinda pending OR/Anesthesia availability  - All risks, benefits, and alternatives to above procedure(s) were discussed with the patient and his family, all of their questions were answered to their expressed satisfaction, patient expresses he wishes to proceed, and informed consent was obtained.   - NPO for ERCP; okay for CLD afterwards and NPO again at midnight  - IV Abx (Zsoyn)  - Monitor abdominal examination  - Pain control prn; antiemetics prn  - Monitor LFTs/Bilirubin  - Mobilize   - Further management per primary service; we will follow along   All of the above findings and recommendations were discussed with the patient and his family, and all of their questions were answered to their expressed satisfaction.  Thank you for the opportunity to participate in this patient's care.   --  Arthea Platt, PA-C [facility] 11/20/2023, 7:25 AM M-F: 7am - 4pm

## 2023-11-20 NOTE — Op Note (Signed)
 [facility] Gastroenterology Patient Name: Frank Lucero Procedure Date: 11/20/2023 11:45 AM MRN: [id number] Account #: [id number] Date of Birth: 01/16/1981 Admit Type: Inpatient Age: 43 Room: [facility] ENDO ROOM 4 Gender: Male Note Status: Finalized Instrument Name: [id number] Procedure:             ERCP Indications:           Common bile duct stone(s), Jaundice, Elevated liver                         enzymes Providers:             Rogelia Copping MD, MD Medicines:             Propofol per Anesthesia Complications:         No immediate complications. Procedure:             Pre-Anesthesia Assessment:                        - Prior to the procedure, a History and Physical was                         performed, and patient medications and allergies were                         reviewed. The patient's tolerance of previous                         anesthesia was also reviewed. The risks and benefits                         of the procedure and the sedation options and risks                         were discussed with the patient. All questions were                         answered, and informed consent was obtained. Prior                         Anticoagulants: The patient has taken no anticoagulant                         or antiplatelet agents. ASA Grade Assessment: II - A                         patient with mild systemic disease. After reviewing                         the risks and benefits, the patient was deemed in                         satisfactory condition to undergo the procedure.                        After obtaining informed consent, the scope was passed                         under direct vision. Throughout the procedure,  the                         patient's blood pressure, pulse, and oxygen                         saturations were monitored continuously. The                         Duodenoscope was introduced through the mouth, and                          used to inject contrast into and used to cannulate the                         bile duct. The ERCP was accomplished without                         difficulty. The patient tolerated the procedure well. Findings:      The scout film was normal. The esophagus was successfully intubated       under direct vision. The scope was advanced to a normal major papilla in       the descending duodenum without detailed examination of the pharynx,       larynx and associated structures, and upper GI tract. The upper GI tract       was grossly normal. The bile duct was deeply cannulated with the       short-nosed traction sphincterotome. Contrast was injected. I personally       interpreted the bile duct images. There was brisk flow of contrast       through the ducts. Image quality was excellent. Contrast extended to the       entire biliary tree. A wire was passed into the biliary tree. A 7 mm       biliary sphincterotomy was made with a monofilament traction (standard)       sphincterotome using ERBE electrocautery. There was no       post-sphincterotomy bleeding. The biliary tree was swept with a 15 mm       balloon starting at the bifurcation. Sludge was swept from the duct. Impression:            - A biliary sphincterotomy was performed.                        - The biliary tree was swept and sludge was found. Recommendation:        - Return patient to hospital ward for ongoing care.                        - Clear liquid diet.                        - Continue present medications.                        - Watch for pancreatitis, bleeding, perforation, and                         cholangitis. Procedure Code(s):     --- Professional ---  [phone], Endoscopic retrograde cholangiopancreatography                         (ERCP); with removal of calculi/debris from                         biliary/pancreatic duct(s)                        43262, Endoscopic retrograde  cholangiopancreatography                         (ERCP); with sphincterotomy/papillotomy                        [phone], Endoscopic catheterization of the biliary                         ductal system, radiological supervision and                         interpretation Diagnosis Code(s):     --- Professional ---                        R17, Unspecified jaundice                        K80.50, Calculus of bile duct without cholangitis or                         cholecystitis without obstruction                        R74.8, Abnormal levels of other serum enzymes CPT copyright 2022 American [employer]. All rights reserved. The codes documented in this report are preliminary and upon coder review may  be revised to meet current compliance requirements. Rogelia Copping MD, MD 11/20/2023 12:20:06 PM This report has been signed electronically. Number of Addenda: 0 Note Initiated On: 11/20/2023 11:45 AM Estimated Blood Loss:  Estimated blood loss: none.      [facility]

## 2023-11-20 NOTE — Progress Notes (Signed)
 PROGRESS NOTE   HPI was taken from Dr. Laurita: Frank Lucero is a 43 y.o. male with medical history significant of type 2 diabetes, Gilbert's syndrome, acid reflux, who presents to the hospital with abdominal pain. His symptoms started 2 or 3 months ago, intermittent.  Has been seen in the emergency room, was diagnosed with gallstone.  But symptom was much worse today, he had a sudden onset of right upper quadrant abdominal earlier this morning.  Severe 10/10, intermittent.  Pressure-like.  Associated with nausea without vomiting.  He was also diaphoretic without fever. Upon arriving to hospital, patient was afebrile, no significant leukocytosis.  AST 525, ALT 553, bilirubin 7.4.  MRCP showed gallstone with cholecystitis, small stone in the distal common bile duct.   Consult from general surgery and GI obtained.  Decision is made to have ERCP performed followed by cholecystectomy.  Patient also received IV Zosyn     Richey W Camille  FMW:[id number] DOB: 1981/05/17 DOA: 11/19/2023 PCP: [name], MD  Assessment & Plan:   Principal Problem:   Cholecystitis, acute with cholelithiasis Active Problems:   Adjustment disorder with mixed anxiety and depressed mood   Anxiety disorder   Choledocholithiasis   Uncontrolled type 2 diabetes mellitus with hyperglycemia, without long-term current use of insulin (HCC)   Calculus of gallbladder and bile duct with acute cholecystitis, with obstruction  Assessment and Plan:  Choledocholithiasis: w/ possible stone in distal common bile duct as per MRCP. S/p ERCP in which biliary tree was swept & sludge was found & biliary sphincterotomy was done. Scheduled for today as per GI. Continue on IV zosyn. GI following and recs apprec   Acute cholecystitis: continue on IV zosyn. Will need cholecystectomy. Gen surg following and recs apprec.   Obstructive jaundice: secondary to above. Continue w/ supportive care   Transaminitis: secondary to above.  Will continue to monitor   DM2: likely poorly controlled. Continue on SSI w/ accuchecks     DVT prophylaxis: lovenox Code Status: full  Family Communication: discussed pt's care w/ pt's family at bedside and answered their questions  Disposition Plan: likely d/c back home  Level of care: Med-Surg  Status is: Observation The patient remains OBS appropriate and will d/c before 2 midnights.   Consultants:  GI Gen surg   Procedures:  Antimicrobials: zosyn   Subjective: Pt c/o fatigue   Objective: Vitals:   11/19/23 1805 11/19/23 2019 11/20/23 0208 11/20/23 0812  BP: 130/83 107/62 104/67 118/72  Pulse: 65 (!) 55 62 (!) 53  Resp:  17 18 16   Temp: 98 F (36.7 C) 98.3 F (36.8 C) 98.1 F (36.7 C) 98 F (36.7 C)  TempSrc: Oral Oral Oral   SpO2: 98% 97% 100% 98%  Weight:      Height:       No intake or output data in the 24 hours ending 11/20/23 0904 Filed Weights   11/19/23 1706  Weight: 107.5 kg    Examination:  General exam: Appears calm and comfortable. Scleral icterus  Respiratory system: Clear to auscultation. Respiratory effort normal. Cardiovascular system: S1 & S2+. No rubs, gallops or clicks.  Gastrointestinal system: Abdomen is nondistended, soft and nontender.  Normal bowel sounds heard. Central nervous system: Alert and oriented. Moves all extremities  Psychiatry: Judgement and insight appear normal. Flat mood and affect    Data Reviewed: I have personally reviewed following labs and imaging studies  CBC: Recent Labs  Lab 11/18/23 1725 11/19/23 1017 11/19/23 1153 11/19/23 1840 11/20/23  0359  WBC 9.3 11.1* 9.1 8.7 7.2  NEUTROABS 5.5 9.0* 7.5  --   --   HGB 16.2 16.1 14.9 15.4 13.6  HCT 48.1 46.6 43.8 45.4 41.7  MCV 87.5 87.1 88.1 88.5 89.7  PLT 233 239 213 196 182   Basic Metabolic Panel: Recent Labs  Lab 11/18/23 1725 11/19/23 1153 11/19/23 1840 11/20/23 0359  NA 139 139  --  141  K 4.0 4.6  --  5.0  CL 100 105  --  108  CO2  21* 23  --  27  GLUCOSE 119* 110*  --  96  BUN 15 14  --  12  CREATININE 0.93 0.86 0.90 0.92  CALCIUM 9.7 9.2  --  9.0  MG  --   --   --  2.2   GFR: Estimated Creatinine Clearance: 139.3 mL/min (by C-G formula based on SCr of 0.92 mg/dL). Liver Function Tests: Recent Labs  Lab 11/18/23 1725 11/19/23 1153 11/20/23 0359  AST 25 525* 338*  ALT 18 553* 559*  ALKPHOS 88 234* 257*  BILITOT 2.4* 7.4* 7.2*  PROT 7.6 6.8 6.0*  ALBUMIN 4.5 4.0 3.6   Recent Labs  Lab 11/18/23 1725 11/19/23 1153  LIPASE 32 32   No results for input(s): AMMONIA in the last 168 hours. Coagulation Profile: No results for input(s): INR, PROTIME in the last 168 hours. Cardiac Enzymes: No results for input(s): CKTOTAL, CKMB, CKMBINDEX, TROPONINI in the last 168 hours. BNP (last 3 results) No results for input(s): PROBNP in the last 8760 hours. HbA1C: No results for input(s): HGBA1C in the last 72 hours. CBG: Recent Labs  Lab 11/19/23 1846 11/19/23 2133 11/20/23 0811  GLUCAP 122* 107* 104*   Lipid Profile: No results for input(s): CHOL, HDL, LDLCALC, TRIG, CHOLHDL, LDLDIRECT in the last 72 hours. Thyroid Function Tests: No results for input(s): TSH, T4TOTAL, FREET4, T3FREE, THYROIDAB in the last 72 hours. Anemia Panel: No results for input(s): VITAMINB12, FOLATE, FERRITIN, TIBC, IRON, RETICCTPCT in the last 72 hours. Sepsis Labs: No results for input(s): PROCALCITON, LATICACIDVEN in the last 168 hours.  No results found for this or any previous visit (from the past 240 hours).       Radiology Studies: MR ABDOMEN MRCP W WO CONTAST Result Date: 11/19/2023 CLINICAL DATA:  Cholelithiasis; Cholelithiasis 144615 Pain [id number]. EXAM: MRI ABDOMEN WITHOUT AND WITH CONTRAST (INCLUDING MRCP) TECHNIQUE: Multiplanar multisequence MR imaging of the abdomen was performed both before and after the administration of intravenous contrast. Heavily  T2-weighted images of the biliary and pancreatic ducts were obtained, and three-dimensional MRCP images were rendered by post processing. CONTRAST:  10mL GADAVIST GADOBUTROL 1 MMOL/ML IV SOLN COMPARISON:  Ultrasound abdomen from 11/19/2023 and CT scan abdomen and pelvis from 11/03/2023. FINDINGS: Lower chest: Unremarkable MR appearance to the lung bases. No pleural effusion. No pericardial effusion. Normal heart size. Hepatobiliary: The liver is normal in size and configuration. The gallbladder is distended and exhibit mild diffuse gallbladder wall thickening/edema. There is also mild pericholecystic fat stranding and trace amount of pericholecystic fluid. No discrete cholelithiasis seen. Findings favor acute cholecystitis. No intra or extrahepatic bile duct dilation. There is a single 2 mm dependent filling defect in the distal common bile duct (series 4, image 26), which may represent a tiny choledocholithiasis. Pancreas: No mass, inflammatory changes or other parenchymal abnormality identified. No main pancreatic duct dilation. Spleen:  Within normal limits in size and appearance. No focal mass. Adrenals/Urinary Tract: Unremarkable adrenal glands. No hydroureteronephrosis. No suspicious  renal mass. Stomach/Bowel: Visualized portions within the abdomen are unremarkable. No disproportionate dilation of bowel loops. Vascular/Lymphatic: No pathologically enlarged lymph nodes identified. No abdominal aortic aneurysm demonstrated. No ascites. Other:  None. Musculoskeletal: No suspicious bone lesions identified. IMPRESSION: 1. Findings favor acute cholecystitis. There is a single 2 mm dependent filling defect in the distal common bile duct, which may represent a tiny choledocholithiasis. No intra or extrahepatic bile duct dilation. 2. Otherwise essentially unremarkable exam, as described above. Electronically Signed   By: Ree Molt M.D.   On: 11/19/2023 14:39   MR 3D Recon At Scanner Result Date:  11/19/2023 CLINICAL DATA:  Cholelithiasis; Cholelithiasis 144615 Pain [id number]. EXAM: MRI ABDOMEN WITHOUT AND WITH CONTRAST (INCLUDING MRCP) TECHNIQUE: Multiplanar multisequence MR imaging of the abdomen was performed both before and after the administration of intravenous contrast. Heavily T2-weighted images of the biliary and pancreatic ducts were obtained, and three-dimensional MRCP images were rendered by post processing. CONTRAST:  10mL GADAVIST GADOBUTROL 1 MMOL/ML IV SOLN COMPARISON:  Ultrasound abdomen from 11/19/2023 and CT scan abdomen and pelvis from 11/03/2023. FINDINGS: Lower chest: Unremarkable MR appearance to the lung bases. No pleural effusion. No pericardial effusion. Normal heart size. Hepatobiliary: The liver is normal in size and configuration. The gallbladder is distended and exhibit mild diffuse gallbladder wall thickening/edema. There is also mild pericholecystic fat stranding and trace amount of pericholecystic fluid. No discrete cholelithiasis seen. Findings favor acute cholecystitis. No intra or extrahepatic bile duct dilation. There is a single 2 mm dependent filling defect in the distal common bile duct (series 4, image 26), which may represent a tiny choledocholithiasis. Pancreas: No mass, inflammatory changes or other parenchymal abnormality identified. No main pancreatic duct dilation. Spleen:  Within normal limits in size and appearance. No focal mass. Adrenals/Urinary Tract: Unremarkable adrenal glands. No hydroureteronephrosis. No suspicious renal mass. Stomach/Bowel: Visualized portions within the abdomen are unremarkable. No disproportionate dilation of bowel loops. Vascular/Lymphatic: No pathologically enlarged lymph nodes identified. No abdominal aortic aneurysm demonstrated. No ascites. Other:  None. Musculoskeletal: No suspicious bone lesions identified. IMPRESSION: 1. Findings favor acute cholecystitis. There is a single 2 mm dependent filling defect in the distal common bile  duct, which may represent a tiny choledocholithiasis. No intra or extrahepatic bile duct dilation. 2. Otherwise essentially unremarkable exam, as described above. Electronically Signed   By: Ree Molt M.D.   On: 11/19/2023 14:39   US  Abdomen Limited RUQ (LIVER/GB) Result Date: 11/19/2023 CLINICAL DATA:  Right upper quadrant pain EXAM: ULTRASOUND ABDOMEN LIMITED RIGHT UPPER QUADRANT COMPARISON:  Ultrasound and CT 11/03/2023 FINDINGS: Gallbladder: Dilated gallbladder. Few shadowing stones. Slight areas of wall thickening up to 4 cm. No perinephric fluid however the sonographer reports pain when scanning the gallbladder. Common bile duct: Diameter: 6 mm, borderline enlarged Liver: No focal lesion identified. Within normal limits in parenchymal echogenicity. Portal vein is patent on color Doppler imaging with normal direction of blood flow towards the liver. Other: None. IMPRESSION: Dilated gallbladder. Stones are seen with slight wall thickening. Is also reported Murphy's sign. Please correlate for other clinical evidence of acute cholecystitis and if needed further workup with HIDA scan as clinically appropriate. Common duct at the upper limits of normal 6 mm. Electronically Signed   By: Ranell Bring M.D.   On: 11/19/2023 10:44        Scheduled Meds:  diclofenac  100 mg Rectal Once   enoxaparin (LOVENOX) injection  40 mg Subcutaneous Q24H   insulin aspart  0-5 Units Subcutaneous QHS  insulin aspart  0-9 Units Subcutaneous TID WC   Continuous Infusions:  acetaminophen      lactated ringers 100 mL/hr at 11/19/23 1830   piperacillin-tazobactam 3.375 g (11/20/23 0533)     LOS: 1 day       Anthony CHRISTELLA Pouch, MD [facility] Pager [phone]  If 7PM-7AM, please contact night-coverage [email or web address] 11/20/2023, 9:04 AM

## 2023-11-20 NOTE — Anesthesia Preprocedure Evaluation (Signed)
 Anesthesia Evaluation  Patient identified by MRN, date of birth, ID band Patient awake    Reviewed: Allergy & Precautions, NPO status , Patient's Chart, lab work & pertinent test results  History of Anesthesia Complications Negative for: history of anesthetic complications  Airway Mallampati: III  TM Distance: >3 FB Neck ROM: full    Dental  (+) Loose   Pulmonary neg pulmonary ROS, former smoker   Pulmonary exam normal        Cardiovascular negative cardio ROS Normal cardiovascular exam     Neuro/Psych negative neurological ROS  negative psych ROS   GI/Hepatic ,GERD  Medicated,,Gilbert's syndrome,   Endo/Other  negative endocrine ROSdiabetes    Renal/GU negative Renal ROS  negative genitourinary   Musculoskeletal   Abdominal   Peds  Hematology negative hematology ROS (+)   Anesthesia Other Findings Past Medical History: 06/25/2009: GERD (gastroesophageal reflux disease)     Comment:  EGD Dr Harvey small HH, mild gastritis, duodenitis 2011 T BILI 1.6: Gilbert syndrome     Comment:  2014: 7 BILI 1.4 IDBILI 1.2 06/2009: Helicobacter pylori gastritis     Comment:  (SEROLOGY POS [facility]/ s/p treatment ), Bx NEG FEB 2011 No date: Hemorrhoids JAN 2011 305 LBS, BMI 37.26: NASH (nonalcoholic steatohepatitis)     Comment:  MAR 2011 ABD U/S-FATTY LIVER No date: Obesity No date: Type 2 diabetes mellitus (HCC)     Comment:  diet controlled  Past Surgical History: No date: 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 Harvey: diverticulosis, redundant left colon,               internal and external hemorrhoids 12/05/2010: GIVENS CAPSULE STUDY     Comment:  SLF: NL TI AND COLON/Small internal hemorrhoids FEB 2011 NV, AP: UPPER  GASTROINTESTINAL ENDOSCOPY     Comment:  HH, CHRONIC GASTRITIS, DUODENITIS  BMI    Body Mass Index: 28.85 kg/m      Reproductive/Obstetrics negative OB ROS                              Anesthesia Physical Anesthesia Plan  ASA: 2  Anesthesia Plan: General   Post-op Pain Management:    Induction: Intravenous  PONV Risk Score and Plan: 1 and Propofol infusion and TIVA  Airway Management Planned: Natural Airway and Nasal Cannula  Additional Equipment:   Intra-op Plan:   Post-operative Plan:   Informed Consent: I have reviewed the patients History and Physical, chart, labs and discussed the procedure including the risks, benefits and alternatives for the proposed anesthesia with the patient or authorized representative who has indicated his/her understanding and acceptance.     Dental Advisory Given  Plan Discussed with: Anesthesiologist, CRNA and Surgeon  Anesthesia Plan Comments: (Patient consented for risks of anesthesia including but not limited to:  - adverse reactions to medications - risk of airway placement if required - damage to eyes, teeth, lips or other oral mucosa - nerve damage due to positioning  - sore throat or hoarseness - Damage to heart, brain, nerves, lungs, other parts of body or loss of life  Patient voiced understanding and assent.)  Anesthesia Quick Evaluation

## 2023-11-20 NOTE — Progress Notes (Signed)
 I have reviewed and concur with this student's documentation.   Carola Beal, RN 11/20/2023 2:12 PM

## 2023-11-20 NOTE — Transfer of Care (Signed)
 Immediate Anesthesia Transfer of Care Note  Patient: Frank Lucero  Procedure(s) Performed: ERCP, WITH INTERVENTION IF INDICATED  Patient Location: Endoscopy Unit  Anesthesia Type:General  Level of Consciousness: drowsy  Airway & Oxygen Therapy: Patient Spontanous Breathing  Post-op Assessment: Report given to RN and Post -op Vital signs reviewed and stable  Post vital signs: Reviewed and stable  Last Vitals:  Vitals Value Taken Time  BP 105/71 1227  Temp 35.8 1227  Pulse 63 11/20/23 12:27  Resp 21 11/20/23 12:27  SpO2 96 % 11/20/23 12:27  Vitals shown include unfiled device data.  Last Pain:  Vitals:   11/20/23 1026  TempSrc: Temporal  PainSc: 0-No pain         Complications: No notable events documented.

## 2023-11-20 NOTE — Consult Note (Signed)
 Frank Copping, MD [facility]  [address], [address] [place], [place] [address] Phone: [phone] Fax : [phone]  Consultation  Referring Provider:     Dr. Laurita Primary Care Physician:  Frank Elspeth BRAVO, MD Primary Gastroenterologist: Frank Lucero         Reason for Consultation:     Common bile duct stone  Date of Admission:  11/19/2023 Date of Consultation:  11/20/2023         HPI:   Frank Lucero is a 43 y.o. male who presented to [facility] with a history of GERD and abdominal pain that had been intermittent for the last 2 to 3 months.  He also has a history of Gilbert's syndrome.  The patient was diagnosed with cholecystitis and suggestive of a small common bile duct stone in the distal common bile duct.  The patient was reporting his pain to be 10 out of 10 when evaluated yesterday in the emergency department by the staff there.  The patient was transferred to our hospital due to inability to obtain an ERCP at [facility].  The patient was treated with pain medication and a GI consult and surgical consults were called. The patient reports that he is having some pain today which she states is due to his medication wearing off.  The patient's liver enzyme this morning remain elevated.  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 Harvey: diverticulosis, redundant left colon, internal and external hemorrhoids   GIVENS CAPSULE STUDY  12/05/2010   SLF: NL TI AND COLON/Small internal hemorrhoids   UPPER GASTROINTESTINAL ENDOSCOPY  FEB 2011 NV, AP   HH, CHRONIC GASTRITIS, DUODENITIS    Prior to Admission medications   Not on File    Family History  Problem Relation Age of Onset   Colon cancer Maternal Grandfather 66   Colon cancer Paternal Grandfather      Social History   Tobacco Use   Smoking status: Former    Current packs/day: 0.00    Average packs/day: 1 pack/day for 18.0 years (18.0 ttl pk-yrs)    Types: Cigarettes    Start date: 11/11/1991    Quit date: 11/10/2009    Years since quitting: 14.0    Passive exposure: Past   Smokeless tobacco: Never  Vaping Use   Vaping status: Never Used  Substance Use Topics   Alcohol use: No   Drug use: No    Allergies as of 11/19/2023 - Review Complete 11/19/2023  Allergen Reaction Noted   Niacin and related Rash 12/31/2020    Review of Systems:    All systems reviewed and negative except where noted in HPI.   Physical Exam:  Vital signs in last  24 hours: Temp:  [97.8 F (36.6 C)-98.3 F (36.8 C)] 98.1 F (36.7 C) (07/01 0208) Pulse Rate:  [50-85] 62 (07/01 0208) Resp:  [16-27] 18 (07/01 0208) BP: (102-130)/(55-83) 104/67 (07/01 0208) SpO2:  [93 %-100 %] 100 % (07/01 0208) Weight:  [107.5 kg-112 kg] 107.5 kg (06/30 1706) Last BM Date : 11/18/23 General:   Pleasant, cooperative in NAD Head:  Normocephalic and atraumatic. Eyes:   No icterus.   Conjunctiva pink. PERRLA. Ears:  Normal auditory acuity. Rectal:  Not performed. Neurologic:  Alert and oriented x3;  grossly normal neurologically. Skin:  Intact without significant lesions or rashes. Cervical Nodes:  No significant cervical adenopathy. Psych:  Alert and cooperative. Normal affect.  LAB RESULTS: Recent Labs    11/19/23 1153 11/19/23 1840 11/20/23 0359   WBC 9.1 8.7 7.2  HGB 14.9 15.4 13.6  HCT 43.8 45.4 41.7  PLT 213 196 182   BMET Recent Labs    11/18/23 1725 11/19/23 1153 11/19/23 1840 11/20/23 0359  NA 139 139  --  141  K 4.0 4.6  --  5.0  CL 100 105  --  108  CO2 21* 23  --  27  GLUCOSE 119* 110*  --  96  BUN 15 14  --  12  CREATININE 0.93 0.86 0.90 0.92  CALCIUM 9.7 9.2  --  9.0   LFT Recent Labs    11/19/23 1840 11/20/23 0359  PROT  --  6.0*  ALBUMIN  --  3.6  AST  --  338*  ALT  --  559*  ALKPHOS  --  257*  BILITOT  --  7.2*  BILIDIR 4.4*  --    PT/INR No results for input(s): LABPROT, INR in the last 72 hours.  STUDIES: MR ABDOMEN MRCP W WO CONTAST Result Date: 11/19/2023 CLINICAL DATA:  Cholelithiasis; Cholelithiasis 144615 Pain Q012017. EXAM: MRI ABDOMEN WITHOUT AND WITH CONTRAST (INCLUDING MRCP) TECHNIQUE: Multiplanar multisequence MR imaging of the abdomen was performed both before and after the administration of intravenous contrast. Heavily T2-weighted images of the biliary and pancreatic ducts were obtained, and three-dimensional MRCP images were rendered by post processing. CONTRAST:  10mL GADAVIST GADOBUTROL 1 MMOL/ML IV SOLN COMPARISON:  Ultrasound abdomen from 11/19/2023 and CT scan abdomen and pelvis from 11/03/2023. FINDINGS: Lower chest: Unremarkable MR appearance to the lung bases. No pleural effusion. No pericardial effusion. Normal heart size. Hepatobiliary: The liver is normal in size and configuration. The gallbladder is distended and exhibit mild diffuse gallbladder wall thickening/edema. There is also mild pericholecystic fat stranding and trace amount of pericholecystic fluid. No discrete cholelithiasis seen. Findings favor acute cholecystitis. No intra or extrahepatic bile duct dilation. There is a single 2 mm dependent filling defect in the distal common bile duct (series 4, image 26), which may represent a tiny choledocholithiasis. Pancreas: No mass, inflammatory changes or other  parenchymal abnormality identified. No main pancreatic duct dilation. Spleen:  Within normal limits in size and appearance. No focal mass. Adrenals/Urinary Tract: Unremarkable adrenal glands. No hydroureteronephrosis. No suspicious renal mass. Stomach/Bowel: Visualized portions within the abdomen are unremarkable. No disproportionate dilation of bowel loops. Vascular/Lymphatic: No pathologically enlarged lymph nodes identified. No abdominal aortic aneurysm demonstrated. No ascites. Other:  None. Musculoskeletal: No suspicious bone lesions identified. IMPRESSION: 1. Findings favor acute cholecystitis. There is a single 2 mm dependent filling defect in the distal common bile duct, which may represent a tiny choledocholithiasis. No intra or extrahepatic bile duct dilation. 2. Otherwise essentially unremarkable exam, as  described above. Electronically Signed   By: Ree Molt M.D.   On: 11/19/2023 14:39   MR 3D Recon At Scanner Result Date: 11/19/2023 CLINICAL DATA:  Cholelithiasis; Cholelithiasis 144615 Pain Q012017. EXAM: MRI ABDOMEN WITHOUT AND WITH CONTRAST (INCLUDING MRCP) TECHNIQUE: Multiplanar multisequence MR imaging of the abdomen was performed both before and after the administration of intravenous contrast. Heavily T2-weighted images of the biliary and pancreatic ducts were obtained, and three-dimensional MRCP images were rendered by post processing. CONTRAST:  10mL GADAVIST GADOBUTROL 1 MMOL/ML IV SOLN COMPARISON:  Ultrasound abdomen from 11/19/2023 and CT scan abdomen and pelvis from 11/03/2023. FINDINGS: Lower chest: Unremarkable MR appearance to the lung bases. No pleural effusion. No pericardial effusion. Normal heart size. Hepatobiliary: The liver is normal in size and configuration. The gallbladder is distended and exhibit mild diffuse gallbladder wall thickening/edema. There is also mild pericholecystic fat stranding and trace amount of pericholecystic fluid. No discrete cholelithiasis seen.  Findings favor acute cholecystitis. No intra or extrahepatic bile duct dilation. There is a single 2 mm dependent filling defect in the distal common bile duct (series 4, image 26), which may represent a tiny choledocholithiasis. Pancreas: No mass, inflammatory changes or other parenchymal abnormality identified. No main pancreatic duct dilation. Spleen:  Within normal limits in size and appearance. No focal mass. Adrenals/Urinary Tract: Unremarkable adrenal glands. No hydroureteronephrosis. No suspicious renal mass. Stomach/Bowel: Visualized portions within the abdomen are unremarkable. No disproportionate dilation of bowel loops. Vascular/Lymphatic: No pathologically enlarged lymph nodes identified. No abdominal aortic aneurysm demonstrated. No ascites. Other:  None. Musculoskeletal: No suspicious bone lesions identified. IMPRESSION: 1. Findings favor acute cholecystitis. There is a single 2 mm dependent filling defect in the distal common bile duct, which may represent a tiny choledocholithiasis. No intra or extrahepatic bile duct dilation. 2. Otherwise essentially unremarkable exam, as described above. Electronically Signed   By: Ree Molt M.D.   On: 11/19/2023 14:39   US  Abdomen Limited RUQ (LIVER/GB) Result Date: 11/19/2023 CLINICAL DATA:  Right upper quadrant pain EXAM: ULTRASOUND ABDOMEN LIMITED RIGHT UPPER QUADRANT COMPARISON:  Ultrasound and CT 11/03/2023 FINDINGS: Gallbladder: Dilated gallbladder. Few shadowing stones. Slight areas of wall thickening up to 4 cm. No perinephric fluid however the sonographer reports pain when scanning the gallbladder. Common bile duct: Diameter: 6 mm, borderline enlarged Liver: No focal lesion identified. Within normal limits in parenchymal echogenicity. Portal vein is patent on color Doppler imaging with normal direction of blood flow towards the liver. Other: None. IMPRESSION: Dilated gallbladder. Stones are seen with slight wall thickening. Is also reported  Murphy's sign. Please correlate for other clinical evidence of acute cholecystitis and if needed further workup with HIDA scan as clinically appropriate. Common duct at the upper limits of normal 6 mm. Electronically Signed   By: Ranell Bring M.D.   On: 11/19/2023 10:44      Impression / Plan:   Assessment: Principal Problem:   Cholecystitis, acute with cholelithiasis Active Problems:   Adjustment disorder with mixed anxiety and depressed mood   Anxiety disorder   Choledocholithiasis   Uncontrolled type 2 diabetes mellitus with hyperglycemia, without long-term current use of insulin (HCC)   JEF FUTCH is a 43 y.o. y/o male with acute cholecystitis and abnormal enzymes.  The patient has a history of Gilbert's but his bilirubin was 7.2 with a direct bilirubin of 4.4 and a common bile duct stone was seen on the MRCP.  Plan:  The patient will be set up for an ERCP for today.  The patient has been told the risks and benefits including pancreatitis bleeding perforation and death.  The patient has also been explained about the failure rate of the procedure.  He has agreed to proceed with the procedure today. He will be evaluated by surgery for cholecystectomy to be considered after the ERCP.  Thank you for involving me in the care of this patient.      LOS: 1 day   Frank Copping, MD, MD. NOLIA 11/20/2023, 7:47 AM,  Pager [phone] 7am-5pm  Check AMION for 5pm -7am coverage and on weekends   Note: This dictation was prepared with Dragon dictation along with smaller phrase technology. Any transcriptional errors that result from this process are unintentional.

## 2023-11-20 NOTE — Anesthesia Postprocedure Evaluation (Addendum)
 Anesthesia Post Note  Patient: Frank Lucero  Procedure(s) Performed: ERCP, WITH INTERVENTION IF INDICATED  Patient location during evaluation: Endoscopy Anesthesia Type: General Level of consciousness: awake and alert Pain management: pain level controlled Vital Signs Assessment: post-procedure vital signs reviewed and stable Respiratory status: spontaneous breathing, nonlabored ventilation, respiratory function stable and patient connected to nasal cannula oxygen Cardiovascular status: blood pressure returned to baseline and stable Postop Assessment: no apparent nausea or vomiting Anesthetic complications: no   No notable events documented.   Last Vitals:  Vitals:   11/20/23 1235 11/20/23 1245  BP: 105/67 115/68  Pulse:    Resp:    Temp:    SpO2:      Last Pain:  Vitals:   11/20/23 1245  TempSrc:   PainSc: 0-No pain                 Lendia LITTIE Mae

## 2023-11-21 ENCOUNTER — Inpatient Hospital Stay: Admitting: Anesthesiology

## 2023-11-21 ENCOUNTER — Encounter: Payer: Self-pay | Admitting: Internal Medicine

## 2023-11-21 ENCOUNTER — Other Ambulatory Visit: Payer: Self-pay

## 2023-11-21 ENCOUNTER — Encounter
Admission: EM | Disposition: A | Discharge status: Home / Self Care | Payer: Self-pay | Source: Intra-hospital | Attending: Internal Medicine

## 2023-11-21 DIAGNOSIS — K8001 Calculus of gallbladder with acute cholecystitis with obstruction: Secondary | ICD-10-CM | POA: Diagnosis not present

## 2023-11-21 DIAGNOSIS — K8062 Calculus of gallbladder and bile duct with acute cholecystitis without obstruction: Secondary | ICD-10-CM | POA: Diagnosis not present

## 2023-11-21 LAB — GLUCOSE, CAPILLARY
Glucose-Capillary: 93 mg/dL (ref 70–99)
Glucose-Capillary: 99 mg/dL (ref 70–99)
Glucose-Capillary: 88 mg/dL (ref 70–99)
Glucose-Capillary: 123 mg/dL — ABNORMAL HIGH (ref 70–99)
Glucose-Capillary: 119 mg/dL — ABNORMAL HIGH (ref 70–99)

## 2023-11-21 LAB — CBC
HCT: 38.1 % — ABNORMAL LOW (ref 39.0–52.0)
Hemoglobin: 12.5 g/dL — ABNORMAL LOW (ref 13.0–17.0)
MCH: 29.9 pg (ref 26.0–34.0)
MCHC: 32.8 g/dL (ref 30.0–36.0)
MCV: 91.1 fL (ref 80.0–100.0)
Platelets: 166 10*3/uL (ref 150–400)
RBC: 4.18 MIL/uL — ABNORMAL LOW (ref 4.22–5.81)
RDW: 12.7 % (ref 11.5–15.5)
WBC: 6.3 10*3/uL (ref 4.0–10.5)
nRBC: 0 % (ref 0.0–0.2)

## 2023-11-21 LAB — COMPREHENSIVE METABOLIC PANEL WITH GFR
ALT: 379 U/L — ABNORMAL HIGH (ref 0–44)
AST: 128 U/L — ABNORMAL HIGH (ref 15–41)
Albumin: 3.3 g/dL — ABNORMAL LOW (ref 3.5–5.0)
Alkaline Phosphatase: 241 U/L — ABNORMAL HIGH (ref 38–126)
Anion gap: 5 (ref 5–15)
BUN: 10 mg/dL (ref 6–20)
CO2: 27 mmol/L (ref 22–32)
Calcium: 8.7 mg/dL — ABNORMAL LOW (ref 8.9–10.3)
Chloride: 108 mmol/L (ref 98–111)
Creatinine, Ser: 0.92 mg/dL (ref 0.61–1.24)
GFR, Estimated: >60 mL/min (ref >60)
Glucose, Bld: 82 mg/dL (ref 70–99)
Potassium: 4.5 mmol/L (ref 3.5–5.1)
Sodium: 140 mmol/L (ref 135–145)
Total Bilirubin: 3.2 mg/dL — ABNORMAL HIGH (ref 0.0–1.2)
Total Protein: 5.7 g/dL — ABNORMAL LOW (ref 6.5–8.1)

## 2023-11-21 SURGERY — CHOLECYSTECTOMY, ROBOT-ASSISTED, LAPAROSCOPIC
Anesthesia: General

## 2023-11-21 MED ORDER — ACETAMINOPHEN 10 MG/ML IV SOLN
INTRAVENOUS | Status: DC | PRN
  Administered 2023-11-21: 1000 mg via INTRAVENOUS

## 2023-11-21 MED ORDER — INDOCYANINE GREEN 25 MG IV SOLR
1.2500 mg | INTRAVENOUS | Status: AC
  Administered 2023-11-21: 1.25 mg via INTRAVENOUS

## 2023-11-21 MED ORDER — FENTANYL CITRATE (PF) 100 MCG/2ML IJ SOLN
INTRAMUSCULAR | Status: AC
  Filled 2023-11-21: qty 2

## 2023-11-21 MED ORDER — ROCURONIUM BROMIDE 100 MG/10ML IV SOLN
INTRAVENOUS | Status: DC | PRN
  Administered 2023-11-21: 10 mg via INTRAVENOUS
  Administered 2023-11-21: 20 mg via INTRAVENOUS
  Administered 2023-11-21: 50 mg via INTRAVENOUS

## 2023-11-21 MED ORDER — PROPOFOL 10 MG/ML IV BOLUS
INTRAVENOUS | Status: DC | PRN
  Administered 2023-11-21: 170 mg via INTRAVENOUS

## 2023-11-21 MED ORDER — 0.9 % SODIUM CHLORIDE (POUR BTL) OPTIME
TOPICAL | Status: DC | PRN
  Administered 2023-11-21: 1000 mL

## 2023-11-21 MED ORDER — LIDOCAINE HCL (CARDIAC) PF 100 MG/5ML IV SOSY
PREFILLED_SYRINGE | INTRAVENOUS | Status: DC | PRN
  Administered 2023-11-21: 80 mg via INTRAVENOUS

## 2023-11-21 MED ORDER — BUPIVACAINE-EPINEPHRINE (PF) 0.5% -1:200000 IJ SOLN
INTRAMUSCULAR | Status: AC
  Filled 2023-11-21: qty 10

## 2023-11-21 MED ORDER — DEXAMETHASONE SODIUM PHOSPHATE 10 MG/ML IJ SOLN
INTRAMUSCULAR | Status: DC | PRN
  Administered 2023-11-21: 5 mg via INTRAVENOUS

## 2023-11-21 MED ORDER — DEXMEDETOMIDINE HCL IN NACL 80 MCG/20ML IV SOLN
INTRAVENOUS | Status: DC | PRN
  Administered 2023-11-21: 8 ug via INTRAVENOUS
  Administered 2023-11-21: 12 ug via INTRAVENOUS

## 2023-11-21 MED ORDER — BUPIVACAINE-EPINEPHRINE (PF) 0.5% -1:200000 IJ SOLN
INTRAMUSCULAR | Status: DC | PRN
  Administered 2023-11-21: 4 mL
  Administered 2023-11-21: 16 mL

## 2023-11-21 MED ORDER — DROPERIDOL 2.5 MG/ML IJ SOLN: 0.6250 mg | Freq: Once | INTRAMUSCULAR | Status: DC | PRN

## 2023-11-21 MED ORDER — SUGAMMADEX SODIUM 200 MG/2ML IV SOLN
INTRAVENOUS | Status: DC | PRN
  Administered 2023-11-21: 200 mg via INTRAVENOUS

## 2023-11-21 MED ORDER — PROPOFOL 10 MG/ML IV BOLUS
INTRAVENOUS | Status: AC
Start: 2023-11-21 — End: 2023-11-21
  Filled 2023-11-21: qty 20

## 2023-11-21 MED ORDER — OXYCODONE HCL 5 MG PO TABS
5.0000 mg | ORAL_TABLET | Freq: Once | ORAL | Status: AC
  Administered 2023-11-21: 5 mg via ORAL

## 2023-11-21 MED ORDER — INDOCYANINE GREEN 25 MG IV SOLR
INTRAVENOUS | Status: AC
  Filled 2023-11-21: qty 10

## 2023-11-21 MED ORDER — BUPIVACAINE LIPOSOME 1.3 % IJ SUSP
INTRAMUSCULAR | Status: AC
  Filled 2023-11-21: qty 10

## 2023-11-21 MED ORDER — ROCURONIUM BROMIDE 10 MG/ML (PF) SYRINGE
PREFILLED_SYRINGE | INTRAVENOUS | Status: AC
  Filled 2023-11-21: qty 10

## 2023-11-21 MED ORDER — OXYCODONE HCL 5 MG PO TABS
ORAL_TABLET | ORAL | Status: AC
  Filled 2023-11-21: qty 1

## 2023-11-21 MED ORDER — EPHEDRINE SULFATE-NACL 50-0.9 MG/10ML-% IV SOSY
PREFILLED_SYRINGE | INTRAVENOUS | Status: DC | PRN
  Administered 2023-11-21: 5 mg via INTRAVENOUS

## 2023-11-21 MED ORDER — PIPERACILLIN-TAZOBACTAM 3.375 G IVPB
3.3750 g | Freq: Three times a day (TID) | INTRAVENOUS | Status: DC
  Administered 2023-11-21 – 2023-11-23 (×5): 3.375 g via INTRAVENOUS
  Filled 2023-11-21 (×4): qty 50

## 2023-11-21 MED ORDER — ACETAMINOPHEN 10 MG/ML IV SOLN
INTRAVENOUS | Status: AC
  Filled 2023-11-21: qty 100

## 2023-11-21 MED ORDER — KETOROLAC TROMETHAMINE 30 MG/ML IJ SOLN
INTRAMUSCULAR | Status: AC
  Filled 2023-11-21: qty 1

## 2023-11-21 MED ORDER — LIDOCAINE HCL (PF) 2 % IJ SOLN
INTRAMUSCULAR | Status: AC
  Filled 2023-11-21: qty 5

## 2023-11-21 MED ORDER — FENTANYL CITRATE (PF) 100 MCG/2ML IJ SOLN
25.0000 ug | INTRAMUSCULAR | Status: DC | PRN
  Administered 2023-11-21 (×2): 50 ug via INTRAVENOUS

## 2023-11-21 MED ORDER — OXYCODONE HCL 5 MG PO TABS: 5.0000 mg | ORAL_TABLET | Freq: Once | ORAL | Status: DC

## 2023-11-21 MED ORDER — MIDAZOLAM HCL 2 MG/2ML IJ SOLN
INTRAMUSCULAR | Status: DC | PRN
  Administered 2023-11-21: 2 mg via INTRAVENOUS

## 2023-11-21 MED ORDER — FENTANYL CITRATE (PF) 100 MCG/2ML IJ SOLN
INTRAMUSCULAR | Status: DC | PRN
  Administered 2023-11-21: 100 ug via INTRAVENOUS

## 2023-11-21 MED ORDER — MIDAZOLAM HCL 2 MG/2ML IJ SOLN
INTRAMUSCULAR | Status: AC
Start: 2023-11-21 — End: 2023-11-21
  Filled 2023-11-21: qty 2

## 2023-11-21 SURGICAL SUPPLY — 33 items
BAG PRESSURE INF REUSE 1000 (BAG) IMPLANT
CANNULA REDUCER 12-8 DVNC XI (CANNULA) ×1 IMPLANT
CAUTERY HOOK MNPLR 1.6 DVNC XI (INSTRUMENTS) ×1 IMPLANT
CLIP LIGATING HEMO O LOK GREEN (MISCELLANEOUS) ×1 IMPLANT
DRAPE ARM DVNC X/XI (DISPOSABLE) ×4 IMPLANT
DRAPE COLUMN DVNC XI (DISPOSABLE) ×1 IMPLANT
ELECTRODE REM PT RTRN 9FT ADLT (ELECTROSURGICAL) ×1 IMPLANT
FORCEPS BPLR R/ABLATION 8 DVNC (INSTRUMENTS) ×1 IMPLANT
FORCEPS PROGRASP DVNC XI (FORCEP) ×1 IMPLANT
GLOVE BIOGEL PI IND STRL 7.5 (GLOVE) ×2 IMPLANT
GLOVE SURG SYN 7.0 (GLOVE) ×2 IMPLANT
GLOVE SURG SYN 7.0 PF PI (GLOVE) ×2 IMPLANT
GOWN STRL REUS W/ TWL LRG LVL3 (GOWN DISPOSABLE) ×3 IMPLANT
GRASPER SUT TROCAR 14GX15 (MISCELLANEOUS) ×1 IMPLANT
IRRIGATOR SUCT 8 DISP DVNC XI (IRRIGATION / IRRIGATOR) IMPLANT
IV NS 1000ML BAXH (IV SOLUTION) IMPLANT
KIT PINK PAD W/HEAD ARM REST (MISCELLANEOUS) ×1 IMPLANT
LABEL OR SOLS (LABEL) ×1 IMPLANT
MANIFOLD NEPTUNE II (INSTRUMENTS) ×1 IMPLANT
NDL HYPO 22X1.5 SAFETY MO (MISCELLANEOUS) ×1 IMPLANT
NDL INSUFFLATION 14GA 120MM (NEEDLE) ×1 IMPLANT
NEEDLE HYPO 22X1.5 SAFETY MO (MISCELLANEOUS) ×1 IMPLANT
NEEDLE INSUFFLATION 14GA 120MM (NEEDLE) ×1 IMPLANT
NS IRRIG 500ML POUR BTL (IV SOLUTION) ×1 IMPLANT
OBTURATOR OPTICALSTD 8 DVNC (TROCAR) ×1 IMPLANT
PACK LAP CHOLECYSTECTOMY (MISCELLANEOUS) ×1 IMPLANT
SEAL UNIV 5-12 XI (MISCELLANEOUS) ×4 IMPLANT
SET TUBE SMOKE EVAC HIGH FLOW (TUBING) ×1 IMPLANT
SOLUTION ELECTROSURG ANTI STCK (MISCELLANEOUS) ×1 IMPLANT
SPIKE FLUID TRANSFER (MISCELLANEOUS) ×2 IMPLANT
SUT VICRYL 0 UR6 27IN ABS (SUTURE) ×1 IMPLANT
SUTURE MNCRL 4-0 27XMF (SUTURE) ×1 IMPLANT
SYSTEM BAG RETRIEVAL 10MM (BASKET) ×1 IMPLANT

## 2023-11-21 NOTE — Progress Notes (Signed)
  Progress Note   Patient: Frank Lucero FMW:[id number] DOB: 29-Jun-1980 DOA: 11/19/2023     1 DOS: the patient was seen and examined on 11/21/2023   Brief hospital course:  HRIDAAN BOUSE is a 43 y.o. male with medical history significant of type 2 diabetes, Gilbert's syndrome, acid reflux, who presents to the hospital with abdominal pain. His symptoms started 2 or 3 months ago, intermittent.  Has been seen in the emergency room, was diagnosed with gallstone.  But symptom was much worse today, he had a sudden onset of right upper quadrant abdominal earlier this morning.  Severe 10/10, intermittent.  Pressure-like.  Associated with nausea without vomiting.  He was also diaphoretic without fever. Upon arriving to hospital, patient was afebrile, no significant leukocytosis.  AST 525, ALT 553, bilirubin 7.4.  MRCP showed gallstone with cholecystitis, small stone in the distal common bile duct.   Consult from general surgery and GI obtained.  Decision is made to have ERCP performed followed by cholecystectomy.  Patient also received IV Zosyn   Assessment and Plan:   Choledocholithiasis: w/ possible stone in distal common bile duct as per MRCP.  S/p ERCP in which biliary tree was swept & sludge was found & biliary sphincterotomy was done.  Monitor LFTs and bilirubin     Cholelithiasis with acute cholecystitis:  Continue on IV zosyn.  For robotic assisted laparoscopic cholecystectomy 07/03   Obstructive jaundice: (7.4 >> 3.2) Transaminitis Secondary to choledocholithiasis.  Improving.     DM2:  A1c 4.3 Sliding scale insulin Resume diet when appropriate        Subjective: Patient is seen and examined at the bedside. Scheduled for surgery today  Physical Exam: Vitals:   11/20/23 1357 11/20/23 2019 11/21/23 0542 11/21/23 0817  BP: 106/62 116/74 116/81 117/68  Pulse: (!) 48 (!) 47 (!) 52 (!) 47  Resp: (!) 22 20 17 14   Temp: (!) 97.5 F (36.4 C) (!) 97 F (36.1 C) 98.3 F  (36.8 C) 98.3 F (36.8 C)  TempSrc:  Axillary Oral Oral  SpO2: 100% 98% 100% 98%  Weight:      Height:       General exam: Appears calm and comfortable. Scleral icterus  Respiratory system: Clear to auscultation. Respiratory effort normal. Cardiovascular system: S1 & S2+. No rubs, gallops or clicks. Brdaycardia Gastrointestinal system: Abdomen is nondistended, soft and nontender.  Normal bowel sounds heard. Central nervous system: Alert and oriented. Moves all extremities  Psychiatry: Judgement and insight appear normal. Flat mood and affect      Data Reviewed: AST 128, ALT 379, alk phos 241, total bilirubin 3.2 down from 7.4, hemoglobin 12.5 Labs reviewed  Family Communication: Plan of care discussed with patient and his wife at the bedside.  They verbalized understanding and agree with the plan.  Disposition: Status is: Observation The patient remains OBS appropriate and will d/c before 2 midnights.  Planned Discharge Destination: Home    Time spent: 40 minutes  Author: Aimee Somerset, MD 11/21/2023 1:44 PM  For on call review [email or web address].

## 2023-11-21 NOTE — Anesthesia Preprocedure Evaluation (Signed)
 Anesthesia Evaluation  Patient identified by MRN, date of birth, ID band Patient awake    Reviewed: Allergy & Precautions, NPO status , Patient's Chart, lab work & pertinent test results  History of Anesthesia Complications Negative for: history of anesthetic complications  Airway Mallampati: III  TM Distance: >3 FB Neck ROM: full    Dental  (+) Loose   Pulmonary neg pulmonary ROS, former smoker   Pulmonary exam normal        Cardiovascular negative cardio ROS Normal cardiovascular exam     Neuro/Psych  PSYCHIATRIC DISORDERS Anxiety     negative neurological ROS     GI/Hepatic ,GERD  Medicated,,Gilbert's syndrome,   Endo/Other  diabetes    Renal/GU negative Renal ROS  negative genitourinary   Musculoskeletal   Abdominal   Peds  Hematology negative hematology ROS (+)   Anesthesia Other Findings Past Medical History: 06/25/2009: GERD (gastroesophageal reflux disease)     Comment:  EGD Dr Harvey small HH, mild gastritis, duodenitis 2011 T BILI 1.6: Gilbert syndrome     Comment:  2014: 7 BILI 1.4 IDBILI 1.2 06/2009: Helicobacter pylori gastritis     Comment:  (SEROLOGY POS [facility]/ s/p treatment ), Bx NEG FEB 2011 No date: Hemorrhoids JAN 2011 305 LBS, BMI 37.26: NASH (nonalcoholic steatohepatitis)     Comment:  MAR 2011 ABD U/S-FATTY LIVER No date: Obesity No date: Type 2 diabetes mellitus (HCC)     Comment:  diet controlled  Past Surgical History: No date: 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 Harvey: diverticulosis, redundant left colon,               internal and external hemorrhoids 12/05/2010: GIVENS CAPSULE STUDY     Comment:  SLF: NL TI AND COLON/Small internal hemorrhoids FEB 2011 NV, AP: UPPER  GASTROINTESTINAL ENDOSCOPY     Comment:  HH, CHRONIC GASTRITIS, DUODENITIS  BMI    Body Mass Index: 28.85 kg/m      Reproductive/Obstetrics negative OB ROS                              Anesthesia Physical Anesthesia Plan  ASA: 2  Anesthesia Plan: General   Post-op Pain Management:    Induction: Intravenous  PONV Risk Score and Plan: 1 and Midazolam , Dexamethasone, Ondansetron  and Treatment may vary due to age or medical condition  Airway Management Planned: Oral ETT  Additional Equipment:   Intra-op Plan:   Post-operative Plan: Extubation in OR  Informed Consent: I have reviewed the patients History and Physical, chart, labs and discussed the procedure including the risks, benefits and alternatives for the proposed anesthesia with the patient or authorized representative who has indicated his/her understanding and acceptance.     Dental Advisory Given  Plan Discussed with: Anesthesiologist, CRNA and Surgeon  Anesthesia Plan Comments: (Patient consented for risks of anesthesia including but not limited to:  - adverse reactions to medications - risk of airway placement if required - damage to eyes, teeth, lips or other oral mucosa - nerve damage due to positioning  - sore throat or hoarseness - Damage to heart, brain, nerves, lungs, other parts of body or loss of life  Patient voiced understanding and assent.)         Anesthesia Quick Evaluation

## 2023-11-21 NOTE — Discharge Instructions (Signed)
In addition to included general post-operative instructions,  Diet: Resume home diet. Recommend avoiding or limiting fatty/greasy foods over the next few days/week. If you do eat these, you may (or may not) notice diarrhea. This is expected while your body adjusts to not having a gallbladder, and it typically resolves with time.    Activity: No heavy lifting >20 pounds (children, pets, laundry, garbage) or strenuous activity for 4 weeks, but light activity and walking are encouraged. Do not drive or drink alcohol if taking narcotic pain medications or having pain that might distract from driving.  Wound care: You may shower/get incision wet with soapy water and pat dry (do not rub incisions), but no baths or submerging incision underwater until follow-up.   Medications: Resume all home medications. For mild to moderate pain: acetaminophen (Tylenol) or ibuprofen/naproxen (if no kidney disease). Combining Tylenol with alcohol can substantially increase your risk of causing liver disease. Narcotic pain medications, if prescribed, can be used for severe pain, though may cause nausea, constipation, and drowsiness. Do not combine Tylenol and Percocet (or similar) within a 6 hour period as Percocet (and similar) contain(s) Tylenol. If you do not need the narcotic pain medication, you do not need to fill the prescription.  Call office [phone] / [phone]) at any time if any questions, worsening pain, fevers/chills, bleeding, drainage from incision site, or other concerns.

## 2023-11-21 NOTE — Anesthesia Procedure Notes (Signed)
 Procedure Name: Intubation Date/Time: 11/21/2023 5:58 PM  Performed by: Lacretia Camelia NOVAK, CRNAPre-anesthesia Checklist: Patient identified, Emergency Drugs available, Suction available and Patient being monitored Patient Re-evaluated:Patient Re-evaluated prior to induction Oxygen Delivery Method: Circle system utilized Preoxygenation: Pre-oxygenation with 100% oxygen Induction Type: IV induction Ventilation: Mask ventilation without difficulty Laryngoscope Size: McGrath and 4 Grade View: Grade I Tube type: Oral Tube size: 7.0 mm Number of attempts: 1 Airway Equipment and Method: Video-laryngoscopy and Stylet Placement Confirmation: positive ETCO2, ETT inserted through vocal cords under direct vision and breath sounds checked- equal and bilateral Secured at: 22 cm Tube secured with: Tape Dental Injury: Teeth and Oropharynx as per pre-operative assessment

## 2023-11-21 NOTE — Transfer of Care (Signed)
 Immediate Anesthesia Transfer of Care Note  Patient: Frank Lucero  Procedure(s) Performed: CHOLECYSTECTOMY, ROBOT-ASSISTED, LAPAROSCOPIC  Patient Location: PACU  Anesthesia Type:General  Level of Consciousness: awake, alert , and oriented  Airway & Oxygen Therapy: Patient Spontanous Breathing  Post-op Assessment: Report given to RN and Post -op Vital signs reviewed and stable  Post vital signs: Reviewed and stable  Last Vitals:  Vitals Value Taken Time  BP    Temp    Pulse 66 11/21/23 19:58  Resp 24 11/21/23 19:58  SpO2 97 % 11/21/23 19:58  Vitals shown include unfiled device data.  Last Pain:  Vitals:   11/21/23 1714  TempSrc:   PainSc: 0-No pain         Complications: No notable events documented.

## 2023-11-21 NOTE — Progress Notes (Signed)
 The patient underwent a ERCP yesterday and had no events overnight.  The patient was seen by surgery this morning and is planned to have his gallbladder removed today.  The patient's liver enzymes have improved.  Nothing further to do from a GI point of view.  I will sign off.  Please call if any further GI concerns or questions.  We would like to thank you for the opportunity to participate in the care of Frank Lucero.

## 2023-11-21 NOTE — Op Note (Signed)
 Robotic assisted laparoscopic Cholecystectomy  Pre-operative Diagnosis: Acute Cholecystitis, Choledocholithiasis  Post-operative Diagnosis: Same  Procedure:  Robotic assisted laparoscopic Cholecystectomy  Surgeon: Jayson Endow, MD  Anesthesia: Gen. with endotracheal tube  Findings: Acutely inflamed gallbladder that was distended Able to identify cystic duct going directly into the gallbladder with hepatic plate behind this.    Estimated Blood Loss: 10cc       Specimens: Gallbladder           Complications: none   Procedure Details  The patient was seen again in the Holding Room. The benefits, complications, treatment options, and expected outcomes were discussed with the patient. The risks of bleeding, infection, recurrence of symptoms, failure to resolve symptoms, bile duct damage, bile duct leak, retained common bile duct stone, bowel injury, any of which could require further surgery and/or ERCP, stent, or papillotomy were reviewed with the patient. The likelihood of improving the patient's symptoms with return to their baseline status is good.  The patient and/or family concurred with the proposed plan, giving informed consent.  The patient was taken to Operating Room, identified  and the procedure verified as robotic Cholecystectomy.  A Time Out was held and the above information confirmed.  Prior to the induction of general anesthesia, antibiotic prophylaxis was administered. VTE prophylaxis was in place. General endotracheal anesthesia was then administered and tolerated well. After the induction, the abdomen was prepped with Chloraprep and draped in the sterile fashion. The patient was positioned in the supine position.  A veress needle was inserted into the abdomen using standard drop technique. An 8mm infra-umbilical robotic port was then placed under direct visualization. There was no injury noted at the site of veress needle insertion. Two right sided abdominal 8mm ports  followed by an 12mm left abdominal robotic ports were placed under direct visualization.  The patient was positioned  in reverse Trendelenburg, robot was brought to the surgical field and docked in the standard fashion.  We made sure all the instrumentation was kept indirect view at all times and that there were no collision between the arms. I scrubbed out and went to the console.  The gallbladder was identified, the fundus grasped and retracted cephalad. Adhesions were lysed bluntly. The infundibulum was grasped and retracted laterally, exposing the peritoneum overlying the triangle of Calot. This was then divided and exposed in a blunt fashion. An extended critical view of the cystic duct and cystic artery was obtained.  The cystic duct was clearly identified and bluntly dissected.   Artery was taken with cautery athen clipped and duct was double clipped and divided. Using ICG cholangiography we visualized the cystic duct. The gallbladder was taken from the gallbladder fossa in a retrograde fashion with the electrocautery.  Hemostasis was achieved with the electrocautery. nspection of the right upper quadrant was performed. No bleeding, bile duct injury or leak, or bowel injury was noted. Robotic instruments and robotic arms were undocked in the standard fashion.  I scrubbed back in.  The gallbladder was removed and placed in an Endocatch bag.   The left lower quadrant fascia was then closed with a 0 vicryl using a suture needle passer. The pre-peritoneal space was then infiltrated with liposomal bupivicaine and marcaine solution. Pneumoperitoneum was released.  4-0 subcuticular Monocryl was used to close the skin. Dermabond was  applied.  The patient was then extubated and brought to the recovery room in stable condition. Sponge, lap, and needle counts were correct at closure and at the conclusion of the case.  Jayson Endow, M.D. [facility]

## 2023-11-21 NOTE — Progress Notes (Signed)
 [facility] SURGICAL PROGRESS NOTE  Hospital Day(s): 1.   Interval History:  Patient seen and examined No acute events or new complaints overnight.  Patient reports abdominal pain is improved; still sore in upper abdomen Reports being hungry  No fever, chills, nausea, emesis  He remains without leukocytosis; WBC 6.3K Hgb to 12.5 Renal function normal; sCr - 0.92; UO - unmeasured Bilirubin improving; 3.2 (from 7.4) LFTs improved as well He is NPO this morning Continues on Zosyn  Vital signs in last 24 hours: [min-max] current  Temp:  [97 F (36.1 C)-98.3 F (36.8 C)] 98.3 F (36.8 C) (07/02 0542) Pulse Rate:  [47-65] 52 (07/02 0542) Resp:  [16-22] 17 (07/02 0542) BP: (105-120)/(60-81) 116/81 (07/02 0542) SpO2:  [98 %-100 %] 100 % (07/02 0542)     Height: 6' 4 (193 cm) Weight: 107.5 kg BMI (Calculated): 28.86   Intake/Output last 2 shifts:  07/01 0701 - 07/02 0700 In: 1702.6 [P.O.:180; I.V.:1358.9; IV Piggyback:163.8] Out: 0    Physical Exam:  Constitutional: alert, cooperative and no distress  Respiratory: breathing non-labored at rest  Cardiovascular: regular rate and sinus rhythm  Gastrointestinal: soft, he does not appear overtly tender this AM, and non-distended, no rebound/guarding Integumentary: Still jaundiced but improving, warm, dry   Labs:     Latest Ref Rng & Units 11/21/2023    4:28 AM 11/20/2023    3:59 AM 11/19/2023    6:40 PM  CBC  WBC 4.0 - 10.5 K/uL 6.3  7.2  8.7   Hemoglobin 13.0 - 17.0 g/dL 87.4  86.3  84.5   Hematocrit 39.0 - 52.0 % 38.1  41.7  45.4   Platelets 150 - 400 K/uL 166  182  196       Latest Ref Rng & Units 11/21/2023    4:28 AM 11/20/2023    3:59 AM 11/19/2023    6:40 PM  CMP  Glucose 70 - 99 mg/dL 82  96    BUN 6 - 20 mg/dL 10  12    Creatinine 9.38 - 1.24 mg/dL 9.07  9.07  9.09   Sodium 135 - 145 mmol/L 140  141    Potassium 3.5 - 5.1 mmol/L 4.5  5.0    Chloride 98 - 111 mmol/L 108  108    CO2 22 - 32 mmol/L 27   27    Calcium 8.9 - 10.3 mg/dL 8.7  9.0    Total Protein 6.5 - 8.1 g/dL 5.7  6.0    Total Bilirubin 0.0 - 1.2 mg/dL 3.2  7.2    Alkaline Phos 38 - 126 U/L 241  257    AST 15 - 41 U/L 128  338    ALT 0 - 44 U/L 379  559       Imaging studies: No new pertinent imaging studies   Assessment/Plan: 43 y.o. male with choledocholithiasis s/p ERCP and acute cholecystitis .  - Appreciate GI assistance; ERCP reviewed             - We will plan for robotic assisted laparoscopic cholecystectomy today with Dr Marinda pending OR/Anesthesia availability             - All risks, benefits, and alternatives to above procedure(s) were discussed with the patient and his family, all of their questions were answered to their expressed satisfaction, patient expresses he wishes to proceed, and informed consent was obtained.              - NPO  for planned surgery; IVF support   - ICG on call to OR             - IV Abx (Zsoyn)             - Monitor abdominal examination             - Pain control prn; antiemetics prn             - Monitor LFTs/Bilirubin - improving              - Mobilize as tolerated              - Further management per primary service; we will follow along    All of the above findings and recommendations were discussed with the patient and his family, and all of their questions were answered to their expressed satisfaction.  -- Arthea Platt, PA-C [facility] 11/21/2023, 7:19 AM M-F: 7am - 4pm

## 2023-11-22 ENCOUNTER — Ambulatory Visit: Admitting: Gastroenterology

## 2023-11-22 ENCOUNTER — Ambulatory Visit: Admitting: Surgery

## 2023-11-22 ENCOUNTER — Other Ambulatory Visit: Payer: Self-pay

## 2023-11-22 DIAGNOSIS — K8001 Calculus of gallbladder with acute cholecystitis with obstruction: Secondary | ICD-10-CM | POA: Diagnosis not present

## 2023-11-22 LAB — COMPREHENSIVE METABOLIC PANEL WITH GFR
ALT: 293 U/L — ABNORMAL HIGH (ref 0–44)
AST: 82 U/L — ABNORMAL HIGH (ref 15–41)
Albumin: 3.5 g/dL (ref 3.5–5.0)
Alkaline Phosphatase: 263 U/L — ABNORMAL HIGH (ref 38–126)
Anion gap: 8 (ref 5–15)
BUN: 11 mg/dL (ref 6–20)
CO2: 25 mmol/L (ref 22–32)
Calcium: 8.6 mg/dL — ABNORMAL LOW (ref 8.9–10.3)
Chloride: 105 mmol/L (ref 98–111)
Creatinine, Ser: 0.72 mg/dL (ref 0.61–1.24)
GFR, Estimated: >60 mL/min (ref >60)
Glucose, Bld: 112 mg/dL — ABNORMAL HIGH (ref 70–99)
Potassium: 3.8 mmol/L (ref 3.5–5.1)
Sodium: 138 mmol/L (ref 135–145)
Total Bilirubin: 2.4 mg/dL — ABNORMAL HIGH (ref 0.0–1.2)
Total Protein: 6.1 g/dL — ABNORMAL LOW (ref 6.5–8.1)

## 2023-11-22 LAB — GLUCOSE, CAPILLARY
Glucose-Capillary: 88 mg/dL (ref 70–99)
Glucose-Capillary: 116 mg/dL — ABNORMAL HIGH (ref 70–99)
Glucose-Capillary: 89 mg/dL (ref 70–99)

## 2023-11-22 MED ORDER — OXYCODONE HCL 5 MG PO TABS: 5.0000 mg | ORAL_TABLET | Freq: Four times a day (QID) | ORAL | 0 refills | Status: AC | PRN

## 2023-11-22 MED ORDER — POLYETHYLENE GLYCOL 3350 17 G PO PACK: 17.0000 g | PACK | Freq: Every day | ORAL | 0 refills | Status: AC

## 2023-11-22 MED ORDER — AMOXICILLIN-POT CLAVULANATE 875-125 MG PO TABS
1.0000 | ORAL_TABLET | Freq: Two times a day (BID) | ORAL | 0 refills | Status: AC
  Filled 2023-11-22: qty 10, 5d supply, fill #0

## 2023-11-22 MED ORDER — SENNA 8.6 MG PO TABS: 2.0000 | ORAL_TABLET | Freq: Every evening | ORAL | 0 refills | Status: AC | PRN

## 2023-11-22 MED ORDER — HYDROMORPHONE HCL 1 MG/ML IJ SOLN
0.5000 mg | Freq: Once | INTRAMUSCULAR | Status: AC
  Administered 2023-11-22: 0.5 mg via INTRAVENOUS
  Filled 2023-11-22: qty 0.5

## 2023-11-22 NOTE — Anesthesia Postprocedure Evaluation (Signed)
 Anesthesia Post Note  Patient: Frank Lucero  Procedure(s) Performed: CHOLECYSTECTOMY, ROBOT-ASSISTED, LAPAROSCOPIC  Patient location during evaluation: PACU Anesthesia Type: General Level of consciousness: awake and alert Pain management: pain level controlled Vital Signs Assessment: post-procedure vital signs reviewed and stable Respiratory status: spontaneous breathing, nonlabored ventilation, respiratory function stable and patient connected to nasal cannula oxygen Cardiovascular status: blood pressure returned to baseline and stable Postop Assessment: no apparent nausea or vomiting Anesthetic complications: no   No notable events documented.   Last Vitals:  Vitals:   11/21/23 2231 11/22/23 0359  BP: 117/71 114/71  Pulse: (!) 58 (!) 55  Resp: 16 16  Temp: 36.6 C (!) 36.4 C  SpO2: 96% 95%    Last Pain:  Vitals:   11/22/23 0241  TempSrc:   PainSc: 8                  Prentice Murphy

## 2023-11-22 NOTE — Progress Notes (Signed)
 [facility] SURGICAL PROGRESS NOTE  Hospital Day(s): 3.   Post op day(s): 1 Day Post-Op.   Interval History:  Patient seen and examined No acute events or new complaints overnight.  Patient reports he is sore but doing better this morning Some shoulder pain from insufflation No fever, chills, nausea, emesis Bilirubin improved; now 2.4 Tolerated liquid diet overnight  Vital signs in last 24 hours: [min-max] current  Temp:  [97.5 F (36.4 C)-98.3 F (36.8 C)] 97.5 F (36.4 C) (07/03 0359) Pulse Rate:  [47-84] 55 (07/03 0359) Resp:  [14-24] 16 (07/03 0359) BP: (105-128)/(61-78) 114/71 (07/03 0359) SpO2:  [94 %-100 %] 95 % (07/03 0359)     Height: 6' 4 (193 cm) Weight: 107.5 kg BMI (Calculated): 28.86   Intake/Output last 2 shifts:  07/02 0701 - 07/03 0700 In: 150 [IV Piggyback:150] Out: -    Physical Exam:  Constitutional: alert, cooperative and no distress  Respiratory: breathing non-labored at rest  Cardiovascular: regular rate and sinus rhythm  Gastrointestinal: soft, incisional soreness, and non-distended, no rebound/guarding Integumentary: Laparoscopic incisions are CDI with dermabond, no erythema or drainage   Labs:     Latest Ref Rng & Units 11/21/2023    4:28 AM 11/20/2023    3:59 AM 11/19/2023    6:40 PM  CBC  WBC 4.0 - 10.5 K/uL 6.3  7.2  8.7   Hemoglobin 13.0 - 17.0 g/dL 87.4  86.3  84.5   Hematocrit 39.0 - 52.0 % 38.1  41.7  45.4   Platelets 150 - 400 K/uL 166  182  196       Latest Ref Rng & Units 11/22/2023    3:59 AM 11/21/2023    4:28 AM 11/20/2023    3:59 AM  CMP  Glucose 70 - 99 mg/dL 887  82  96   BUN 6 - 20 mg/dL 11  10  12    Creatinine 0.61 - 1.24 mg/dL 9.27  9.07  9.07   Sodium 135 - 145 mmol/L 138  140  141   Potassium 3.5 - 5.1 mmol/L 3.8  4.5  5.0   Chloride 98 - 111 mmol/L 105  108  108   CO2 22 - 32 mmol/L 25  27  27    Calcium 8.9 - 10.3 mg/dL 8.6  8.7  9.0   Total Protein 6.5 - 8.1 g/dL 6.1  5.7  6.0   Total Bilirubin  0.0 - 1.2 mg/dL 2.4  3.2  7.2   Alkaline Phos 38 - 126 U/L 263  241  257   AST 15 - 41 U/L 82  128  338   ALT 0 - 44 U/L 293  379  559      Imaging studies: No new pertinent imaging studies   Assessment/Plan: 43 y.o. male 1 Day Post-Op s/p robotic assisted laparoscopic cholecystectomy for choledocholithiasis.   - Okay to continue diet as tolerated; Reviewed recommendations after cholecystectomy  - IV Abx (Zosyn); Would do PO Augmentin for 5-7 days at home    - Monitor abdominal examination   - Pain control prn; antiemetics prn   - Continue to mobilize - Further management per primary service  - Discharge Planning: Doing well from surgical perspective, bilirubin continues to improve. Okay for discharge from surgical perspective. Will update instructions and follow up.    All of the above findings and recommendations were discussed with the patient, patient's family (wife at bedside), and the medical team, and all of patient's and family's questions  were answered to their expressed satisfaction.  -- Arthea Platt, PA-C  [facility] 11/22/2023, 8:06 AM M-F: 7am - 4pm

## 2023-11-22 NOTE — Progress Notes (Signed)
 Entered room and introduced myself as RN who helps with admissions and discharges. Patient is in bed with wife at bedside.   Explained to patient medical readiness day is today (11/22/23) and this means we hope he is stable and ready for discharge at that time. Patient agrees he hopes he is ready. Patient's wife is here to transport patient home when medically ready. Explained this date is tentative and based on clinical status and final discharge determination will be made by his provider. Patient voiced understanding.  At this time, there are no social barriers to discharge.

## 2023-11-22 NOTE — Discharge Summary (Addendum)
 Physician Discharge Summary   Patient: Frank Lucero MRN: [id number] DOB: 1980-08-19  Admit date:     11/19/2023  Discharge date: 11/22/23  Discharge Physician: Buford Bremer   PCP: [name], MD   Recommendations at discharge:   Keep scheduled follow-up appointment with surgery  Discharge Diagnoses: Principal Problem:   Cholecystitis, acute with cholelithiasis Active Problems:   Adjustment disorder with mixed anxiety and depressed mood   Anxiety disorder   Choledocholithiasis   Calculus of gallbladder and bile duct with acute cholecystitis, with obstruction   Abnormal liver enzymes  Resolved Problems:   * No resolved hospital problems. *  Hospital Course: [name] is a 43 y.o. male with medical history significant of type 2 diabetes, Gilbert's syndrome, acid reflux, who presents to the hospital with abdominal pain. His symptoms started 2 or 3 months ago, intermittent.  Has been seen in the emergency room, was diagnosed with gallstone.  But symptom was much worse today, he had a sudden onset of right upper quadrant abdominal earlier this morning.  Severe 10/10, intermittent.  Pressure-like.  Associated with nausea without vomiting.  He was also diaphoretic without fever. Upon arriving to hospital, patient was afebrile, no significant leukocytosis.  AST 525, ALT 553, bilirubin 7.4.  MRCP showed gallstone with cholecystitis, small stone in the distal common bile duct.   Consult from general surgery and GI obtained.  Decision is made to have ERCP performed followed by cholecystectomy.  Patient also received IV Zosyn    Assessment and Plan:  Choledocholithiasis: w/ possible stone in distal common bile duct as per MRCP.  S/p ERCP in which biliary tree was swept & sludge was found & biliary sphincterotomy was done.  Liver enzymes and total bilirubin levels show a downward trend     Cholelithiasis with acute cholecystitis:  Was on IV Zosyn and will be  discharged home on Augmentin.  Status post robotic assisted laparoscopic cholecystectomy 07/03     Obstructive jaundice: (7.4 >> 3.2) Transaminitis Secondary to choledocholithiasis.  Improving.      DM2:  A1c 4.3 Maintain consistent carbohydrate diet         Consultants: Surgery Procedures performed: Laparoscopic cholecystectomy Disposition: Home Diet recommendation:  Discharge Diet Orders (From admission, onward)     Start     Ordered   11/22/23 0000  Diet general       Comments: Soft diet   11/22/23 1244           Carb modified diet DISCHARGE MEDICATION: Allergies as of 11/22/2023       Reactions   Niacin And Related Rash        Medication List     TAKE these medications    amoxicillin-clavulanate 875-125 MG tablet Commonly known as: AUGMENTIN Take 1 tablet by mouth 2 (two) times daily for 5 days.   multivitamin with minerals Tabs tablet Take 1 tablet by mouth daily.   oxyCODONE 5 MG immediate release tablet Commonly known as: Oxy IR/ROXICODONE Take 1 tablet (5 mg total) by mouth every 6 (six) hours as needed for up to 5 days for moderate pain (pain score 4-6).   polyethylene glycol 17 g packet Commonly known as: MiraLax Take 17 g by mouth daily.   senna 8.6 MG Tabs tablet Commonly known as: SENOKOT Take 2 tablets (17.2 mg total) by mouth at bedtime as needed for mild constipation or moderate constipation.        Follow-up Information     Marinda Savant  O, MD. Krista on 12/06/2023.   Specialty: General Surgery Why: Go to appointment on 07/17 at 1000 AM Contact information: [address] [place] [address] [phone]                Discharge Exam: Fredricka Weights   11/19/23 1706  Weight: 107.5 kg   General exam: Appears calm and comfortable. Scleral icterus  Respiratory system: Clear to auscultation. Respiratory effort normal. Cardiovascular system: S1 & S2+. No rubs, gallops or clicks.  Bradycardia Gastrointestinal system: Abdomen is nondistended, soft and nontender.  Normal bowel sounds heard. Central nervous system: Alert and oriented. Moves all extremities  Psychiatry: Judgement and insight appear normal. Flat mood and affect     Condition at discharge: stable  The results of significant diagnostics from this hospitalization (including imaging, microbiology, ancillary and laboratory) are listed below for reference.   Imaging Studies: DG C-Arm 1-60 Min-No Report Result Date: 11/20/2023 Fluoroscopy was utilized by the requesting physician.  No radiographic interpretation.   MR ABDOMEN MRCP W WO CONTAST Result Date: 11/19/2023 CLINICAL DATA:  Cholelithiasis; Cholelithiasis 144615 Pain Q012017. EXAM: MRI ABDOMEN WITHOUT AND WITH CONTRAST (INCLUDING MRCP) TECHNIQUE: Multiplanar multisequence MR imaging of the abdomen was performed both before and after the administration of intravenous contrast. Heavily T2-weighted images of the biliary and pancreatic ducts were obtained, and three-dimensional MRCP images were rendered by post processing. CONTRAST:  10mL GADAVIST GADOBUTROL 1 MMOL/ML IV SOLN COMPARISON:  Ultrasound abdomen from 11/19/2023 and CT scan abdomen and pelvis from 11/03/2023. FINDINGS: Lower chest: Unremarkable MR appearance to the lung bases. No pleural effusion. No pericardial effusion. Normal heart size. Hepatobiliary: The liver is normal in size and configuration. The gallbladder is distended and exhibit mild diffuse gallbladder wall thickening/edema. There is also mild pericholecystic fat stranding and trace amount of pericholecystic fluid. No discrete cholelithiasis seen. Findings favor acute cholecystitis. No intra or extrahepatic bile duct dilation. There is a single 2 mm dependent filling defect in the distal common bile duct (series 4, image 26), which may represent a tiny choledocholithiasis. Pancreas: No mass, inflammatory changes or other parenchymal abnormality  identified. No main pancreatic duct dilation. Spleen:  Within normal limits in size and appearance. No focal mass. Adrenals/Urinary Tract: Unremarkable adrenal glands. No hydroureteronephrosis. No suspicious renal mass. Stomach/Bowel: Visualized portions within the abdomen are unremarkable. No disproportionate dilation of bowel loops. Vascular/Lymphatic: No pathologically enlarged lymph nodes identified. No abdominal aortic aneurysm demonstrated. No ascites. Other:  None. Musculoskeletal: No suspicious bone lesions identified. IMPRESSION: 1. Findings favor acute cholecystitis. There is a single 2 mm dependent filling defect in the distal common bile duct, which may represent a tiny choledocholithiasis. No intra or extrahepatic bile duct dilation. 2. Otherwise essentially unremarkable exam, as described above. Electronically Signed   By: Ree Molt M.D.   On: 11/19/2023 14:39   MR 3D Recon At Scanner Result Date: 11/19/2023 CLINICAL DATA:  Cholelithiasis; Cholelithiasis 144615 Pain Q012017. EXAM: MRI ABDOMEN WITHOUT AND WITH CONTRAST (INCLUDING MRCP) TECHNIQUE: Multiplanar multisequence MR imaging of the abdomen was performed both before and after the administration of intravenous contrast. Heavily T2-weighted images of the biliary and pancreatic ducts were obtained, and three-dimensional MRCP images were rendered by post processing. CONTRAST:  10mL GADAVIST GADOBUTROL 1 MMOL/ML IV SOLN COMPARISON:  Ultrasound abdomen from 11/19/2023 and CT scan abdomen and pelvis from 11/03/2023. FINDINGS: Lower chest: Unremarkable MR appearance to the lung bases. No pleural effusion. No pericardial effusion. Normal heart size. Hepatobiliary: The liver is normal in size  and configuration. The gallbladder is distended and exhibit mild diffuse gallbladder wall thickening/edema. There is also mild pericholecystic fat stranding and trace amount of pericholecystic fluid. No discrete cholelithiasis seen. Findings favor acute  cholecystitis. No intra or extrahepatic bile duct dilation. There is a single 2 mm dependent filling defect in the distal common bile duct (series 4, image 26), which may represent a tiny choledocholithiasis. Pancreas: No mass, inflammatory changes or other parenchymal abnormality identified. No main pancreatic duct dilation. Spleen:  Within normal limits in size and appearance. No focal mass. Adrenals/Urinary Tract: Unremarkable adrenal glands. No hydroureteronephrosis. No suspicious renal mass. Stomach/Bowel: Visualized portions within the abdomen are unremarkable. No disproportionate dilation of bowel loops. Vascular/Lymphatic: No pathologically enlarged lymph nodes identified. No abdominal aortic aneurysm demonstrated. No ascites. Other:  None. Musculoskeletal: No suspicious bone lesions identified. IMPRESSION: 1. Findings favor acute cholecystitis. There is a single 2 mm dependent filling defect in the distal common bile duct, which may represent a tiny choledocholithiasis. No intra or extrahepatic bile duct dilation. 2. Otherwise essentially unremarkable exam, as described above. Electronically Signed   By: Ree Molt M.D.   On: 11/19/2023 14:39   US  Abdomen Limited RUQ (LIVER/GB) Result Date: 11/19/2023 CLINICAL DATA:  Right upper quadrant pain EXAM: ULTRASOUND ABDOMEN LIMITED RIGHT UPPER QUADRANT COMPARISON:  Ultrasound and CT 11/03/2023 FINDINGS: Gallbladder: Dilated gallbladder. Few shadowing stones. Slight areas of wall thickening up to 4 cm. No perinephric fluid however the sonographer reports pain when scanning the gallbladder. Common bile duct: Diameter: 6 mm, borderline enlarged Liver: No focal lesion identified. Within normal limits in parenchymal echogenicity. Portal vein is patent on color Doppler imaging with normal direction of blood flow towards the liver. Other: None. IMPRESSION: Dilated gallbladder. Stones are seen with slight wall thickening. Is also reported Murphy's sign. Please  correlate for other clinical evidence of acute cholecystitis and if needed further workup with HIDA scan as clinically appropriate. Common duct at the upper limits of normal 6 mm. Electronically Signed   By: Ranell Bring M.D.   On: 11/19/2023 10:44   US  Abdomen Limited RUQ (LIVER/GB) Result Date: 11/03/2023 CLINICAL DATA:  [id number] Abdominal pain [id number] EXAM: ULTRASOUND ABDOMEN LIMITED RIGHT UPPER QUADRANT COMPARISON:  Same day CT, ultrasound dated August 14, 2019 FINDINGS: Gallbladder: Cholelithiasis. Gallbladder wall appears thickened. There is favored trace pericholecystic fluid no sonographic Murphy sign noted by sonographer. Common bile duct: Diameter: Visualized portion measures 3 mm, within normal limits. Liver: No focal lesion identified. Mildly heterogeneous and upper limits of normal in parenchymal echogenicity. Portal vein is patent on color Doppler imaging with normal direction of blood flow towards the liver. Other: None. IMPRESSION: Cholelithiasis with gallbladder wall thickening and trace pericholecystic fluid. No sonographic Murphy sign noted by sonographer. Findings are suspicious for acute cholecystitis but differential considerations include sequela of chronic cholecystitis or reactive changes. If further imaging is desired, consider further evaluation with HIDA scan. Electronically Signed   By: Corean Salter M.D.   On: 11/03/2023 10:13   CT ABDOMEN PELVIS W CONTRAST Result Date: 11/03/2023 CLINICAL DATA:  Abdominal pain x4 hours.  Nausea also. EXAM: CT ABDOMEN AND PELVIS WITH CONTRAST TECHNIQUE: Multidetector CT imaging of the abdomen and pelvis was performed using the standard protocol following bolus administration of intravenous contrast. RADIATION DOSE REDUCTION: This exam was performed according to the departmental dose-optimization program which includes automated exposure control, adjustment of the mA and/or kV according to patient size and/or use of iterative reconstruction  technique. CONTRAST:  OMNIPAQUE   IOHEXOL  300 MG/ML  SOLN COMPARISON:  None Available. FINDINGS: Lower chest: No abnormality. Hepatobiliary: The liver enhances homogeneously. The patient's arms are in the field creating streak artifact limiting fine detail. No mass is seen through the artifacts. There are no appreciable calcified gallstones but there is slight thickening seen in the gallbladder wall and faint pericholecystic edema concerning for cholecystitis. The bile ducts are normal caliber. Pancreas: No abnormality. Spleen: No mass enhancement.  Splenomegaly with AP dimension 17 cm. Adrenals/Urinary Tract: There is no adrenal mass. There is a 6 mm Bosniak 2 cortical cyst in the posterior aspect of the right kidney which is too small to characterize. No follow-up imaging is recommended. Rest of both kidneys enhanced homogeneously. There is no urinary stone or obstruction. There is no bladder thickening. Stomach/Bowel: No dilatation or wall thickening. An appendix is not seen. Moderate retained stool ascending colon. Uncomplicated sigmoid diverticula. Vascular/Lymphatic: Aortic atherosclerosis. No enlarged abdominal or pelvic lymph nodes. Reproductive: Enlarged prostate 4.8 cm transverse with nodular hypertrophy in the median lobe protruding against the bladder base. Correlate with PSA. Both testicles are in the scrotal sac. Other: Small umbilical fat hernia. No incarcerated hernia. No free fluid or free air. Musculoskeletal: No acute or significant osseous findings. IMPRESSION: 1. Findings concerning for cholecystitis. No calcified gallstones are seen. Ultrasound recommended. 2. Splenomegaly. 3. Constipation and diverticulosis. 4. Aortic atherosclerosis. 5. Enlarged prostate with nodular hypertrophy in the median lobe protruding against the bladder base. Correlate with PSA. 6. Small umbilical fat hernia. Aortic Atherosclerosis (ICD10-I70.0). Electronically Signed   By: Francis Quam M.D.   On: 11/03/2023  06:27    Microbiology: Results for orders placed or performed in visit on 04/02/19  Novel Coronavirus, NAA ([facility])     Status: Abnormal   Collection Time: 04/02/19 12:00 AM   Specimen: Oropharyngeal(OP) collection in vial transport medium   OROPHARYNGEA  TESTING  Result Value Ref Range Status   SARS-CoV-2, NAA Detected (A) Not Detected Final    Comment: Testing was performed using the Aptima SARS-CoV-2 assay. This nucleic acid amplification test was developed and its performance characteristics determined by [employer]. Nucleic acid amplification tests include PCR and TMA. This test has not been FDA cleared or approved. This test has been authorized by FDA under an Emergency Use Authorization (EUA). This test is only authorized for the duration of time the declaration that circumstances exist justifying the authorization of the emergency use of in vitro diagnostic tests for detection of SARS-CoV-2 virus and/or diagnosis of COVID-19 infection under section 564(b)(1) of the Act, 21 U.S.C. 639aaa-6(a) (1), unless the authorization is terminated or revoked sooner. When diagnostic testing is negative, the possibility of a false negative result should be considered in the context of a patient's recent exposures and the presence of clinical signs and symptoms consistent with COVID-19. An individual without symptoms o f COVID-19 and who is not shedding SARS-CoV-2 virus would expect to have a negative (not detected) result in this assay.     Labs: CBC: Recent Labs  Lab 11/18/23 1725 11/19/23 1017 11/19/23 1153 11/19/23 1840 11/20/23 0359 11/21/23 0428  WBC 9.3 11.1* 9.1 8.7 7.2 6.3  NEUTROABS 5.5 9.0* 7.5  --   --   --   HGB 16.2 16.1 14.9 15.4 13.6 12.5*  HCT 48.1 46.6 43.8 45.4 41.7 38.1*  MCV 87.5 87.1 88.1 88.5 89.7 91.1  PLT 233 239 213 196 182 166   Basic Metabolic Panel: Recent Labs  Lab 11/18/23 1725 11/19/23 1153 11/19/23 1840  11/20/23 0359  11/21/23 0428 11/22/23 0359  NA 139 139  --  141 140 138  K 4.0 4.6  --  5.0 4.5 3.8  CL 100 105  --  108 108 105  CO2 21* 23  --  27 27 25   GLUCOSE 119* 110*  --  96 82 112*  BUN 15 14  --  12 10 11   CREATININE 0.93 0.86 0.90 0.92 0.92 0.72  CALCIUM 9.7 9.2  --  9.0 8.7* 8.6*  MG  --   --   --  2.2  --   --    Liver Function Tests: Recent Labs  Lab 11/18/23 1725 11/19/23 1153 11/20/23 0359 11/21/23 0428 11/22/23 0359  AST 25 525* 338* 128* 82*  ALT 18 553* 559* 379* 293*  ALKPHOS 88 234* 257* 241* 263*  BILITOT 2.4* 7.4* 7.2* 3.2* 2.4*  PROT 7.6 6.8 6.0* 5.7* 6.1*  ALBUMIN 4.5 4.0 3.6 3.3* 3.5   CBG: Recent Labs  Lab 11/21/23 1652 11/21/23 2001 11/21/23 2130 11/22/23 0836 11/22/23 1133  GLUCAP 88 123* 119* 88 116*    Discharge time spent: greater than 30 minutes.  Signed: Aimee Somerset, MD [facility] 11/22/2023

## 2023-11-23 DIAGNOSIS — K8001 Calculus of gallbladder with acute cholecystitis with obstruction: Secondary | ICD-10-CM | POA: Diagnosis not present

## 2023-11-23 LAB — GLUCOSE, CAPILLARY
Glucose-Capillary: 88 mg/dL (ref 70–99)
Glucose-Capillary: 92 mg/dL (ref 70–99)

## 2023-11-23 NOTE — Progress Notes (Addendum)
  Progress Note   Patient: Frank Lucero FMW:[id number] DOB: February 19, 1981 DOA: 11/19/2023     4 DOS: the patient was seen and examined on 11/23/2023   Brief hospital course:  Frank Lucero is a 43 y.o. male with medical history significant of type 2 diabetes, Gilbert's syndrome, acid reflux, who presents to the hospital with abdominal pain. His symptoms started 2 or 3 months ago, intermittent.  Has been seen in the emergency room, was diagnosed with gallstone.  But symptom was much worse today, he had a sudden onset of right upper quadrant abdominal earlier this morning.  Severe 10/10, intermittent.  Pressure-like.  Associated with nausea without vomiting.  He was also diaphoretic without fever. Upon arriving to hospital, patient was afebrile, no significant leukocytosis.  AST 525, ALT 553, bilirubin 7.4.  MRCP showed gallstone with cholecystitis, small stone in the distal common bile duct.   Consult from general surgery and GI obtained.  Decision is made to have ERCP performed followed by cholecystectomy.  Patient also received IV Zosyn     Assessment and Plan:  Choledocholithiasis: w/ possible stone in distal common bile duct as per MRCP.  S/p ERCP in which biliary tree was swept & sludge was found & biliary sphincterotomy was done.  Total bilirubin levels show a downward trend     Cholelithiasis with acute cholecystitis:  Continue on IV zosyn .  S/p laparoscopic cholecystectomy 07/03 Will discharge home on Augmentin      Obstructive jaundice: (7.4 >> 3.2) Transaminitis Secondary to choledocholithiasis.  Improving.      DM2:  A1c 4.3 Sliding scale insulin  Diet controlled   Bilateral shoulder pain Related to insufflation from laparoscopic surgery Pain control Ambulation    Subjective: Continues to complain of shoulder pain rated 7/10 in intensity at its worst.  Physical Exam: Vitals:   11/22/23 1650 11/22/23 2040 11/23/23 0345 11/23/23 0803  BP: 120/76 114/75  118/72 110/70  Pulse: (!) 55 (!) 58 (!) 51 (!) 53  Resp: 16 16 16 18   Temp: 97.8 F (36.6 C) (!) 97.4 F (36.3 C) 98 F (36.7 C) (!) 97.5 F (36.4 C)  TempSrc: Oral Oral Oral Oral  SpO2: 100% 99% 99% 98%  Weight:      Height:       General exam: Appears calm and comfortable. Scleral icterus  Respiratory system: Clear to auscultation. Respiratory effort normal. Cardiovascular system: S1 & S2+. No rubs, gallops or clicks. Brdaycardia Gastrointestinal system: Abdomen is nondistended, soft and nontender.  Normal bowel sounds heard. Central nervous system: Alert and oriented. Moves all extremities  Psychiatry: Judgement and insight appear normal. Flat mood and affect    Data Reviewed: Sodium 138, potassium 3.8, AST 82, ALT 293, alk phos 263 Labs reviewed  Family Communication: Plan of care discussed with patient and the wife at the bedside  Disposition: Status is: Inpatient Remains inpatient appropriate because: Pain control  Planned Discharge Destination: Home    Time spent: 35 minutes  Author: Aimee Somerset, MD 11/23/2023 10:57 AM  For on call review [email or web address].

## 2023-11-26 LAB — GLUCOSE, CAPILLARY
Glucose-Capillary: 119 mg/dL — ABNORMAL HIGH (ref 70–99)
Glucose-Capillary: 81 mg/dL (ref 70–99)

## 2023-11-27 LAB — SURGICAL PATHOLOGY

## 2023-11-29 ENCOUNTER — Telehealth: Payer: Self-pay | Admitting: *Deleted

## 2023-11-29 NOTE — Telephone Encounter (Signed)
 Faxed FMLA (Spouse Kahlel Peake) to [employer] at [phone]

## 2023-12-06 ENCOUNTER — Ambulatory Visit (INDEPENDENT_AMBULATORY_CARE_PROVIDER_SITE_OTHER): Admitting: General Surgery

## 2023-12-06 ENCOUNTER — Encounter: Payer: Self-pay | Admitting: General Surgery

## 2023-12-06 VITALS — BP 119/71 | HR 63 | Ht 76.0 in | Wt 235.0 lb

## 2023-12-06 DIAGNOSIS — K8050 Calculus of bile duct without cholangitis or cholecystitis without obstruction: Secondary | ICD-10-CM

## 2023-12-06 DIAGNOSIS — Z09 Encounter for follow-up examination after completed treatment for conditions other than malignant neoplasm: Secondary | ICD-10-CM

## 2023-12-06 DIAGNOSIS — K8062 Calculus of gallbladder and bile duct with acute cholecystitis without obstruction: Secondary | ICD-10-CM

## 2023-12-06 NOTE — Progress Notes (Signed)
 The patient returns today status post robotic assisted cholecystectomy for choledocholithiasis.  He reports doing well.  He says that he is eating well.  He does have a little bit of constipation.  He reports that his abdominal pain is well-controlled.  He denies any erythema or drainage from his incisions.  On exam his abdomen is soft, nontender and nondistended.  His robotic port sites are healing well.  Some of the glue is coming off his umbilical incision.  I peeled this off.  His pathology is consistent with acute cholecystitis.  No immediate postoperative complications.  I recommend he continue lifting restrictions for 2 more weeks of nothing greater than 10 to 15 pounds.  I recommended laxatives if he does not have bowel movements over the next 1 to 2 days.  He can follow-up with us  as needed

## 2023-12-06 NOTE — Patient Instructions (Addendum)
 GENERAL POST-OPERATIVE PATIENT INSTRUCTIONS   WOUND CARE INSTRUCTIONS:  Try to keep the wound dry and avoid ointments on the wound unless directed to do so.  If the wound becomes bright red and painful or starts to drain infected material that is not clear, please contact your physician immediately.  If the wound is mildly pink and has a thick firm ridge underneath it, this is normal, and is referred to as a healing ridge.  This will resolve over the next 4-6 weeks.  BATHING: You may shower if you have been informed of this by your surgeon. However, Please do not submerge in a tub, hot tub, or pool until incisions are completely sealed or have been told by your surgeon that you may do so.  DIET:  You may eat any foods that you can tolerate.  It is a good idea to eat a high fiber diet and take in plenty of fluids to prevent constipation.  If you do become constipated you may want to take a mild laxative or take ducolax tablets on a daily basis until your bowel habits are regular.  Constipation can be very uncomfortable, along with straining, after recent surgery.  ACTIVITY:  You are encouraged to walk and engage in light activity for the next two weeks.  You should not lift more than 20 pounds for 6 weeks total after surgery as it could put you at increased risk for complications.  Twenty pounds is roughly equivalent to a plastic bag of groceries. At that time- Listen to your body when lifting, if you have pain when lifting, stop and then try again in a few days. Soreness after doing exercises or activities of daily living is normal as you get back in to your normal routine.  MEDICATIONS:  Try to take narcotic medications and anti-inflammatory medications, such as tylenol, ibuprofen, naprosyn, etc., with food.  This will minimize stomach upset from the medication.  Should you develop nausea and vomiting from the pain medication, or develop a rash, please discontinue the medication and contact your  physician.  You should not drive, make important decisions, or operate machinery when taking narcotic pain medication.  SUNBLOCK Use sun block to incision area over the next year if this area will be exposed to sun. This helps decrease scarring and will allow you avoid a permanent darkened area over your incision.  QUESTIONS:  Please feel free to call our office if you have any questions, and we will be glad to assist you. [phone]

## 2023-12-26 ENCOUNTER — Ambulatory Visit (INDEPENDENT_AMBULATORY_CARE_PROVIDER_SITE_OTHER): Admitting: Podiatry

## 2023-12-26 ENCOUNTER — Encounter: Payer: Self-pay | Admitting: Podiatry

## 2023-12-26 DIAGNOSIS — L03039 Cellulitis of unspecified toe: Secondary | ICD-10-CM

## 2023-12-26 DIAGNOSIS — B351 Tinea unguium: Secondary | ICD-10-CM | POA: Diagnosis not present

## 2023-12-26 MED ORDER — CEPHALEXIN 500 MG PO CAPS: 500.0000 mg | ORAL_CAPSULE | Freq: Three times a day (TID) | ORAL | 0 refills | Status: AC

## 2023-12-26 NOTE — Progress Notes (Signed)
  Subjective:  Patient ID: Frank Lucero, male    DOB: November 30, 1980,   MRN: [id number]  Chief Complaint  Patient presents with   Diabetes    My toenails are turning colors.  They have ridges.  I have redness under my toenails.  When water  hits my feet in the shower, they burn.  Feels like a slight sunburn.  Saw Dr. Elspeth Messier - about 2 weeks ago; A1c - 4.3    43 y.o. male presents for concern as above. Denies any current treatments . Denies any other pedal complaints. Denies n/v/f/c.   Past Medical History:  Diagnosis Date   GERD (gastroesophageal reflux disease) 06/25/2009   EGD Dr Harvey small HH, mild gastritis, duodenitis   Bertrum syndrome 2011 T BILI 1.6   2014: 7 BILI 1.4 IDBILI 1.2   Helicobacter pylori gastritis 06/2009   (SEROLOGY POS [facility]/ s/p treatment ), Bx NEG FEB 2011   Hemorrhoids    NASH (nonalcoholic steatohepatitis) JAN 2011 305 LBS, BMI 37.14 Aug 2009 ABD U/S-FATTY LIVER   Obesity    Type 2 diabetes mellitus (HCC)    diet controlled    Objective:  Physical Exam: Vascular: DP/PT pulses 2/4 bilateral. CFT <3 seconds. Normal hair growth on digits. No edema.  Skin. No lacerations or abrasions bilateral feet. Bilateral hallux nails are thickened and dystrophic and discolored. Mild erythema noted to proximal nail fold more so on right. Tender to palpation.  Musculoskeletal: MMT 5/5 bilateral lower extremities in DF, PF, Inversion and Eversion. Deceased ROM in DF of ankle joint.  Neurological: Sensation intact to light touch.   Assessment:   1. Onychomycosis   2. Paronychia of great toe      Plan:  Patient was evaluated and treated and all questions answered. -Examined patient -Discussed treatment options for painful dystrophic nails  -Clinical picture and Fungal culture was obtained by removing a portion of the hard nail itself from each of the involved toenails using a sterile nail nipper and sent to [facility] lab. Patient tolerated the biopsy  procedure well without discomfort or need for anesthesia.  Keflex  sent for some concern of erythema and paronychia proximal nail fold.  -Discussed fungal nail treatment options including oral, topical, and laser treatments.  -Patient to return in 4 weeks for follow up evaluation and discussion of fungal culture results or sooner if symptoms worsen.   Asberry Failing, DPM

## 2023-12-26 NOTE — Addendum Note (Signed)
 Addended by: WAYLAN ELIDIA PARAS on: 12/26/2023 04:47 PM   Modules accepted: Orders

## 2024-01-03 ENCOUNTER — Other Ambulatory Visit: Payer: Self-pay | Admitting: Podiatry

## 2024-01-23 ENCOUNTER — Ambulatory Visit (INDEPENDENT_AMBULATORY_CARE_PROVIDER_SITE_OTHER): Admitting: Podiatry

## 2024-01-23 ENCOUNTER — Encounter: Payer: Self-pay | Admitting: Podiatry

## 2024-01-23 DIAGNOSIS — L03031 Cellulitis of right toe: Secondary | ICD-10-CM | POA: Diagnosis not present

## 2024-01-23 DIAGNOSIS — L03032 Cellulitis of left toe: Secondary | ICD-10-CM | POA: Diagnosis not present

## 2024-01-23 NOTE — Progress Notes (Signed)
  Subjective:  Patient ID: Frank Lucero, male    DOB: [birth year],   MRN: [id number]  Chief Complaint  Patient presents with   Nail Problem    I'm supposed to be getting the results for the test.  I think.    43 y.o. male presents for follow-up of thickened nail and to discuss culture results.  . Denies any other pedal complaints. Denies n/v/f/c.   Past Medical History:  Diagnosis Date   GERD (gastroesophageal reflux disease) 06/25/2009   EGD Dr Harvey small HH, mild gastritis, duodenitis   Bertrum syndrome 2011 T BILI 1.6   2014: 7 BILI 1.4 IDBILI 1.2   Helicobacter pylori gastritis 06/2009   (SEROLOGY POS [facility]/ s/p treatment ), Bx NEG FEB 2011   Hemorrhoids    NASH (nonalcoholic steatohepatitis) JAN 2011 305 LBS, BMI 37.14 Aug 2009 ABD U/S-FATTY LIVER   Obesity    Type 2 diabetes mellitus (HCC)    diet controlled    Objective:  Physical Exam: Vascular: DP/PT pulses 2/4 bilateral. CFT <3 seconds. Normal hair growth on digits. No edema.  Skin. No lacerations or abrasions bilateral feet. Bilateral hallux nails are thickened and dystrophic and discolored. Mild erythema noted to proximal nail fold more so on right. Tender to palpation.  Musculoskeletal: MMT 5/5 bilateral lower extremities in DF, PF, Inversion and Eversion. Deceased ROM in DF of ankle joint.  Neurological: Sensation intact to light touch.   Assessment:   1. Paronychia of great toe of right foot   2. Paronychia of great toe of left foot       Plan:  Patient was evaluated and treated and all questions answered. -Examined patient -Discussed treatment options for painful dystrophic nails  -Culture negative for fungus. .  -Discussed  nail treatment options including removal vs topicals. Patient opts for removal today.    Patient requesting removal of ingrown nail today. Procedure below.  Discussed procedure and post procedure care and patient expressed understanding.  Will follow-up in 2 weeks for  nail check or sooner if any problems arise.    Procedure:  Procedure: total Nail Avulsion of bilaterally hallux nail  Surgeon: Asberry Failing, DPM  Pre-op Dx: Ingrown toenail without infection Post-op: Same  Place of Surgery: Office exam room.  Indications for surgery: Painful and ingrown toenail.    The patient is requesting removal of nail without  chemical matrixectomy. Risks and complications were discussed with the patient for which they understand and written consent was obtained. Under sterile conditions a total of 3 mL of  1% lidocaine  plain was infiltrated in a hallux block fashion. Once anesthetized, the skin was prepped in sterile fashion. A tourniquet was then applied. Next the entir3e hallux nails were removed and area copiously irrigated. Silvadene was applied. A dry sterile dressing was applied. After application of the dressing the tourniquet was removed and there is found to be an immediate capillary refill time to the digit. The patient tolerated the procedure well without any complications. Post procedure instructions were discussed the patient for which he verbally understood. Follow-up in two weeks for nail check or sooner if any problems are to arise. Discussed signs/symptoms of infection and directed to call the office immediately should any occur or go directly to the emergency room. In the meantime, encouraged to call the office with any questions, concerns, changes symptoms.    Asberry Failing, DPM

## 2024-01-23 NOTE — Patient Instructions (Signed)

## 2024-02-06 ENCOUNTER — Encounter: Payer: Self-pay | Admitting: Podiatry

## 2024-02-06 ENCOUNTER — Ambulatory Visit (INDEPENDENT_AMBULATORY_CARE_PROVIDER_SITE_OTHER): Admitting: Podiatry

## 2024-02-06 DIAGNOSIS — L84 Corns and callosities: Secondary | ICD-10-CM

## 2024-02-06 DIAGNOSIS — L03032 Cellulitis of left toe: Secondary | ICD-10-CM

## 2024-02-06 DIAGNOSIS — L03031 Cellulitis of right toe: Secondary | ICD-10-CM

## 2024-02-06 NOTE — Progress Notes (Signed)
  Subjective:  Patient ID: Frank Lucero, male    DOB: [birth year],   MRN: [id number]  Chief Complaint  Patient presents with   Nail Problem    They're good other than that place that came up on the side of my left big toe.    43 y.o. male presents for follow-up of bilateral hallux nail removal. Relates doing well. Does have small area to left hallux he would like checked. Has been doing a lot of walking.   . Denies any other pedal complaints. Denies n/v/f/c.   Past Medical History:  Diagnosis Date   GERD (gastroesophageal reflux disease) 06/25/2009   EGD Dr Harvey small HH, mild gastritis, duodenitis   Bertrum syndrome 2011 T BILI 1.6   2014: 7 BILI 1.4 IDBILI 1.2   Helicobacter pylori gastritis 06/2009   (SEROLOGY POS [facility]/ s/p treatment ), Bx NEG FEB 2011   Hemorrhoids    NASH (nonalcoholic steatohepatitis) JAN 2011 305 LBS, BMI 37.14 Aug 2009 ABD U/S-FATTY LIVER   Obesity    Type 2 diabetes mellitus (HCC)    diet controlled    Objective:  Physical Exam: Vascular: DP/PT pulses 2/4 bilateral. CFT <3 seconds. Normal hair growth on digits. No edema.  Skin. No lacerations or abrasions bilateral feet. Bilateral hallux nail beds well healed. Hyperkeratotic slightly hemorrhagic lesion to medial left hallux.  Musculoskeletal: MMT 5/5 bilateral lower extremities in DF, PF, Inversion and Eversion. Deceased ROM in DF of ankle joint.  Neurological: Sensation intact to light touch.   Assessment:   1. Paronychia of great toe of right foot   2. Paronychia of great toe of left foot   3. Foot callus        Plan:  Patient was evaluated and treated and all questions answered. Toe was evaluated and appears to be healing well.  May discontinue soaks and neosporin.  Small callus to the medial left hallux trimmed without inicdent as courtesy.  Patient to follow-up as needed.      Asberry Failing, DPM

## 2024-02-07 ENCOUNTER — Other Ambulatory Visit: Payer: Self-pay
